# Patient Record
Sex: Female | Born: 1945 | Race: White | Hispanic: No | Marital: Married | Smoking: Never smoker
Health system: Southern US, Community
[De-identification: ages and names within clinical notes are randomized; demographics above are authoritative.]

## PROBLEM LIST (undated history)

## (undated) DIAGNOSIS — M1990 Unspecified osteoarthritis, unspecified site: Secondary | ICD-10-CM

## (undated) DIAGNOSIS — D649 Anemia, unspecified: Secondary | ICD-10-CM

## (undated) DIAGNOSIS — E785 Hyperlipidemia, unspecified: Secondary | ICD-10-CM

## (undated) DIAGNOSIS — Z5189 Encounter for other specified aftercare: Secondary | ICD-10-CM

## (undated) DIAGNOSIS — E0590 Thyrotoxicosis, unspecified without thyrotoxic crisis or storm: Secondary | ICD-10-CM

## (undated) DIAGNOSIS — I10 Essential (primary) hypertension: Secondary | ICD-10-CM

## (undated) DIAGNOSIS — M810 Age-related osteoporosis without current pathological fracture: Secondary | ICD-10-CM

## (undated) DIAGNOSIS — J069 Acute upper respiratory infection, unspecified: Secondary | ICD-10-CM

## (undated) HISTORY — PX: OTHER SURGICAL HISTORY: SHX169

## (undated) HISTORY — DX: Encounter for other specified aftercare: Z51.89

## (undated) HISTORY — DX: Anemia, unspecified: D64.9

## (undated) HISTORY — DX: Age-related osteoporosis without current pathological fracture: M81.0

## (undated) HISTORY — PX: DILATION AND CURETTAGE OF UTERUS: SHX78

---

## 1968-11-07 DIAGNOSIS — Z5189 Encounter for other specified aftercare: Secondary | ICD-10-CM

## 1968-11-07 HISTORY — DX: Encounter for other specified aftercare: Z51.89

## 2011-03-10 HISTORY — PX: COLONOSCOPY: SHX174

## 2011-04-17 ENCOUNTER — Encounter (HOSPITAL_COMMUNITY): Payer: Self-pay | Admitting: Pharmacist

## 2011-04-23 ENCOUNTER — Other Ambulatory Visit: Payer: Self-pay

## 2011-04-23 ENCOUNTER — Encounter (HOSPITAL_COMMUNITY): Payer: Self-pay

## 2011-04-23 ENCOUNTER — Encounter (HOSPITAL_COMMUNITY)
Admission: RE | Admit: 2011-04-23 | Discharge: 2011-04-23 | Disposition: A | Discharge status: Home / Self Care | Payer: PRIVATE HEALTH INSURANCE | Source: Ambulatory Visit | Attending: Obstetrics and Gynecology | Admitting: Obstetrics and Gynecology

## 2011-04-23 HISTORY — DX: Acute upper respiratory infection, unspecified: J06.9

## 2011-04-23 HISTORY — DX: Unspecified osteoarthritis, unspecified site: M19.90

## 2011-04-23 HISTORY — DX: Thyrotoxicosis, unspecified without thyrotoxic crisis or storm: E05.90

## 2011-04-23 HISTORY — DX: Encounter for other specified aftercare: Z51.89

## 2011-04-23 HISTORY — DX: Essential (primary) hypertension: I10

## 2011-04-23 HISTORY — DX: Hyperlipidemia, unspecified: E78.5

## 2011-04-23 LAB — BASIC METABOLIC PANEL
BUN: 16 mg/dL (ref 6–23)
CO2: 30 mEq/L (ref 19–32)
Chloride: 103 mEq/L (ref 96–112)
GFR calc Af Amer: >90 mL/min (ref >90)
Glucose, Bld: 88 mg/dL (ref 70–99)
Potassium: 3.3 mEq/L — ABNORMAL LOW (ref 3.5–5.1)

## 2011-04-23 LAB — CBC
HCT: 38 % (ref 36.0–46.0)
Hemoglobin: 12.8 g/dL (ref 12.0–15.0)
MCH: 28.3 pg (ref 26.0–34.0)
MCHC: 33.7 g/dL (ref 30.0–36.0)
MCV: 84.1 fL (ref 78.0–100.0)

## 2011-04-23 LAB — SURGICAL PCR SCREEN
MRSA, PCR: NEGATIVE
Staphylococcus aureus: NEGATIVE

## 2011-04-23 NOTE — Patient Instructions (Addendum)
   Your procedure is scheduled on: Thursday, Feb 21st  Enter through the [employer] of [facility] at: [employer] up the phone at the desk and dial [phone] and inform us of your arrival.  Please call this number if you have any problems the morning of surgery: [phone]  Remember: Do not eat food after midnight: Wednesday Do not drink clear liquids after: Wednesday Take these medicines the morning of surgery with a SIP OF WATER: None, patient takes BP meds at night.  Patient to bring Albuterol inhaler with her on day of surgery.  Do not wear jewelry, make-up, or FINGER nail polish Do not wear lotions, powders, perfumes or deodorant. Do not shave 48 hours prior to surgery. Do not bring valuables to the hospital.  Leave suitcase in the car. After Surgery it may be brought to your room. For patients being admitted to the hospital, checkout time is 11:00am the day of discharge. Home with   Patients discharged on the day of surgery will not be allowed to drive home.     Remember to use your hibiclens as instructed.Please shower with 1/2 bottle the evening before your surgery and the other 1/2 bottle the morning of surgery.

## 2011-04-23 NOTE — Pre-Procedure Instructions (Signed)
Reviewed patient's history and meds with Dr Arby Barrette.  Patient had the flu in Dec. 2012 but still continues to have congestion/cough.  On Monday, 04/20/11, patient was given zithromax and albuterol inhaler to help treat sx.  Patient was also given a new BP med.  Her BP today was 168/75.  Dr Arby Barrette informed.  Ok to continue with PAT appt.  Per Dr Arby Barrette, ok to see patient DOS.  Instructed patient to bring inhaler with her on DOS. Patient verbalized understanding.

## 2011-04-23 NOTE — Pre-Procedure Instructions (Signed)
Inform Sangita, Lab Tech at [facility] of patient's history of blood transfusion-2 units in [place] in the 1970's.

## 2011-04-27 NOTE — H&P (Signed)
  Patient name  Marcile, Fuquay DICTATION#  [id number] CSN#   [id number]  Juluis Mire, MD 04/27/2011 1:37 PM

## 2011-04-28 NOTE — H&P (Signed)
Audrey Holland, Audrey Holland               ACCOUNT NO.:  [id number]  MEDICAL RECORD NO.:  [id number]  LOCATION:  [facility]                           FACILITY:  [facility]  PHYSICIAN:  Juluis Mire, M.D.   DATE OF BIRTH:  [birth year]  DATE OF ADMISSION:  04/23/2011 DATE OF DISCHARGE:  04/23/2011                             HISTORY & PHYSICAL   HISTORY OF PRESENT ILLNESS:  The patient is a 66 year old, gravida 8, para 7 postmenopausal patient presents for surgical management of pelvic relaxation with associated stress incontinence.  The patient had complained of complete uterine prolapse with something protruding from her vagina.  She was also having stress incontinence at this point in time.  Examination revealed complete uterine prolapse with a significant cystocele.  We did do urodynamic testing in the office.  She did leak, had normal leak point pressures and borderline urethral pressure profile.  There was no evidence of obstructive voiding pattern.  No overactive bladder activity.  Alternatives for management have been discussed including use of pessary versus surgical management for which she is admitted at the present time.  Plan is to proceed with a laparoscopic-assisted vaginal hysterectomy, bilateral salpingo- oophorectomy, anterior and posterior repair, sacrospinous ligament suspension, and mid urethral sling along with cystoscopy.  ALLERGIES:  She has no known drug allergies.  MEDICATIONS:  She is on lisinopril 40 mg a day, hydrochlorothiazide 12.5 mg a day, albuterol 2 puffs every 6 hours.  PAST MEDICAL HISTORY:  Does have a history of hypertension under active management.  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  SOCIAL HISTORY:  Reveals no tobacco, alcohol use.  FAMILY HISTORY:  There is a family history of breast cancer and diabetes.  REVIEW OF SYSTEMS:  Noncontributory.  PHYSICAL EXAMINATION:  VITAL SIGNS:  The patient is afebrile.  Stable vital  signs. HEENT:  The patient is normocephalic.  Pupils equal, round, reactive to light and accommodation.  Extraocular movements were intact.  Sclerae and conjunctivae clear.  Oropharynx clear. NECK:  She does have a midline thyroid mass.  States has been evaluated in [place] with previous biopsy and has been watched closely.  No lymphadenopathy is noted. BREASTS:  No skin or nipple changes.  No dominant mass, adenopathy, or nipple discharge. LUNGS:  Clear. CARDIOVASCULAR:  Regular rhythm and rate.  There are no murmurs or gallops.  No carotid or abdominal bruits. ABDOMEN:  Benign.  No mass, organomegaly, or tenderness. PELVIC:  Normal external genitalia.  Does have complete prolapse of the uterus along with significant cystocele and mild rectocele.  Uterus felt to be of normal size and shape.  Adnexa free of mass or tenderness. EXTREMITIES:  Trace edema. NEUROLOGIC:  Grossly within limits.  IMPRESSION: 1. 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 2. Thyroid nodules 3. Hypertension.  PLAN:  Again alternatives have been discussed.  The patient will undergo a laparoscopic-assisted vaginal hysterectomy with bilateral salpingo- oophorectomy, A and P repair, mid urethral sling, and sacrospinous ligament suspension.  The nature of procedure have been discussed. Potential risk of recurrence have been explained.  This could be  in the 25-30% range.  Risk of surgery discussed including the risk of infection, the risk of hemorrhage that could require transfusion with the risk of AIDS, hepatitis.  Risk of injury to adjacent organs such as bladder, bowel, ureters that could require further exploratory surgery. Risk of deep venous thrombosis and pulmonary embolus.  In view of using the sling, we discussed that we are using the mesh material.  Mesh can erode into the vagina, bladder or urethra requiring surgical removal. Mesh rejection  can lead to chronic pelvic pain and painful intercourse requiring removal of the mesh.  There is a risk of over tightening of the mesh that could lead to obstructive voiding pattern requiring loosening the sling with some possible recurrence of incontinence.  We have also discussed the development of bladder spasms that can require medical therapy.  We discussed both the obturator nerve injury as well as pudendal nerve injury and associated symptomatology.  The patient expressed understanding of indications, risk as well as alternatives.     Juluis Mire, M.D.     JSM/MEDQ  D:  04/27/2011  T:  04/28/2011  Job:  [id number]

## 2011-04-30 ENCOUNTER — Other Ambulatory Visit: Payer: Self-pay | Admitting: Obstetrics and Gynecology

## 2011-04-30 ENCOUNTER — Encounter (HOSPITAL_COMMUNITY): Payer: Self-pay | Admitting: Anesthesiology

## 2011-04-30 ENCOUNTER — Inpatient Hospital Stay (HOSPITAL_COMMUNITY)
Admission: RE | Admit: 2011-04-30 | Discharge: 2011-05-01 | DRG: 743 | Disposition: A | Discharge status: Home / Self Care | Payer: PRIVATE HEALTH INSURANCE | Source: Ambulatory Visit | Attending: Obstetrics and Gynecology | Admitting: Obstetrics and Gynecology

## 2011-04-30 ENCOUNTER — Inpatient Hospital Stay (HOSPITAL_COMMUNITY): Payer: PRIVATE HEALTH INSURANCE | Admitting: Anesthesiology

## 2011-04-30 ENCOUNTER — Encounter (HOSPITAL_COMMUNITY)
Admission: RE | Disposition: A | Discharge status: Home / Self Care | Payer: Self-pay | Source: Ambulatory Visit | Attending: Obstetrics and Gynecology

## 2011-04-30 ENCOUNTER — Encounter (HOSPITAL_COMMUNITY): Payer: Self-pay | Admitting: *Deleted

## 2011-04-30 DIAGNOSIS — N812 Incomplete uterovaginal prolapse: Principal | ICD-10-CM | POA: Diagnosis present

## 2011-04-30 DIAGNOSIS — D251 Intramural leiomyoma of uterus: Secondary | ICD-10-CM | POA: Diagnosis present

## 2011-04-30 DIAGNOSIS — Z90710 Acquired absence of both cervix and uterus: Secondary | ICD-10-CM

## 2011-04-30 DIAGNOSIS — N393 Stress incontinence (female) (male): Secondary | ICD-10-CM | POA: Diagnosis present

## 2011-04-30 DIAGNOSIS — N840 Polyp of corpus uteri: Secondary | ICD-10-CM | POA: Diagnosis present

## 2011-04-30 DIAGNOSIS — N8189 Other female genital prolapse: Secondary | ICD-10-CM

## 2011-04-30 HISTORY — PX: ANTERIOR AND POSTERIOR REPAIR: SHX5121

## 2011-04-30 HISTORY — PX: SALPINGOOPHORECTOMY: SHX82

## 2011-04-30 HISTORY — PX: CYSTOSCOPY: SHX5120

## 2011-04-30 HISTORY — PX: LAPAROSCOPIC ASSISTED VAGINAL HYSTERECTOMY: SHX5398

## 2011-04-30 HISTORY — PX: BLADDER SUSPENSION: SHX72

## 2011-04-30 SURGERY — HYSTERECTOMY, VAGINAL, LAPAROSCOPY-ASSISTED
Anesthesia: General | Site: Vagina | Wound class: Clean Contaminated

## 2011-04-30 MED ORDER — ONDANSETRON HCL 4 MG/2ML IJ SOLN: 4.0000 mg | Freq: Four times a day (QID) | INTRAMUSCULAR | Status: DC | PRN

## 2011-04-30 MED ORDER — PROPOFOL 10 MG/ML IV EMUL
INTRAVENOUS | Status: DC | PRN
  Administered 2011-04-30: 170 mg via INTRAVENOUS

## 2011-04-30 MED ORDER — LIDOCAINE HCL (CARDIAC) 20 MG/ML IV SOLN
INTRAVENOUS | Status: AC
  Filled 2011-04-30: qty 5

## 2011-04-30 MED ORDER — FENTANYL CITRATE 0.05 MG/ML IJ SOLN
INTRAMUSCULAR | Status: AC
  Filled 2011-04-30: qty 5

## 2011-04-30 MED ORDER — NEOSTIGMINE METHYLSULFATE 1 MG/ML IJ SOLN
INTRAMUSCULAR | Status: DC | PRN
  Administered 2011-04-30: 1.5 mg via INTRAVENOUS

## 2011-04-30 MED ORDER — MENTHOL 3 MG MT LOZG: 1.0000 | LOZENGE | OROMUCOSAL | Status: DC | PRN

## 2011-04-30 MED ORDER — KETOROLAC TROMETHAMINE 30 MG/ML IJ SOLN
INTRAMUSCULAR | Status: DC | PRN
  Administered 2011-04-30: 30 mg via INTRAVENOUS

## 2011-04-30 MED ORDER — ONDANSETRON HCL 4 MG PO TABS: 4.0000 mg | ORAL_TABLET | Freq: Four times a day (QID) | ORAL | Status: DC | PRN

## 2011-04-30 MED ORDER — LIDOCAINE-EPINEPHRINE (PF) 1 %-1:200000 IJ SOLN
INTRAMUSCULAR | Status: AC
  Filled 2011-04-30: qty 10

## 2011-04-30 MED ORDER — OXYCODONE-ACETAMINOPHEN 5-325 MG PO TABS: 1.0000 | ORAL_TABLET | ORAL | Status: DC | PRN

## 2011-04-30 MED ORDER — ESTRADIOL 0.1 MG/GM VA CREA
TOPICAL_CREAM | VAGINAL | Status: AC
  Filled 2011-04-30: qty 42.5

## 2011-04-30 MED ORDER — BUPIVACAINE HCL (PF) 0.25 % IJ SOLN
INTRAMUSCULAR | Status: AC
  Filled 2011-04-30: qty 30

## 2011-04-30 MED ORDER — ONDANSETRON HCL 4 MG/2ML IJ SOLN
INTRAMUSCULAR | Status: AC
  Filled 2011-04-30: qty 2

## 2011-04-30 MED ORDER — PROMETHAZINE HCL 25 MG/ML IJ SOLN: 6.2500 mg | INTRAMUSCULAR | Status: DC | PRN

## 2011-04-30 MED ORDER — DIPHENHYDRAMINE HCL 50 MG/ML IJ SOLN: 12.5000 mg | Freq: Four times a day (QID) | INTRAMUSCULAR | Status: DC | PRN

## 2011-04-30 MED ORDER — ACETAMINOPHEN 325 MG PO TABS: 325.0000 mg | ORAL_TABLET | ORAL | Status: DC | PRN

## 2011-04-30 MED ORDER — NEOSTIGMINE METHYLSULFATE 1 MG/ML IJ SOLN
INTRAMUSCULAR | Status: AC
  Filled 2011-04-30: qty 10

## 2011-04-30 MED ORDER — LIDOCAINE HCL (CARDIAC) 20 MG/ML IV SOLN
INTRAVENOUS | Status: DC | PRN
  Administered 2011-04-30: 80 mg via INTRAVENOUS

## 2011-04-30 MED ORDER — FENTANYL CITRATE 0.05 MG/ML IJ SOLN
INTRAMUSCULAR | Status: DC | PRN
  Administered 2011-04-30: 100 ug via INTRAVENOUS
  Administered 2011-04-30: 50 ug via INTRAVENOUS
  Administered 2011-04-30: 100 ug via INTRAVENOUS
  Administered 2011-04-30: 50 ug via INTRAVENOUS

## 2011-04-30 MED ORDER — SODIUM CHLORIDE 0.9 % IJ SOLN: 9.0000 mL | INTRAMUSCULAR | Status: DC | PRN

## 2011-04-30 MED ORDER — MIDAZOLAM HCL 2 MG/2ML IJ SOLN
INTRAMUSCULAR | Status: AC
  Filled 2011-04-30: qty 2

## 2011-04-30 MED ORDER — FENTANYL CITRATE 0.05 MG/ML IJ SOLN
INTRAMUSCULAR | Status: AC
  Filled 2011-04-30: qty 2

## 2011-04-30 MED ORDER — ACETAMINOPHEN 325 MG PO TABS: 650.0000 mg | ORAL_TABLET | ORAL | Status: DC | PRN

## 2011-04-30 MED ORDER — BUPIVACAINE-EPINEPHRINE (PF) 0.5% -1:200000 IJ SOLN
INTRAMUSCULAR | Status: AC
  Filled 2011-04-30: qty 10

## 2011-04-30 MED ORDER — CEFAZOLIN SODIUM 1-5 GM-% IV SOLN
INTRAVENOUS | Status: AC
  Filled 2011-04-30: qty 50

## 2011-04-30 MED ORDER — INDIGOTINDISULFONATE SODIUM 8 MG/ML IJ SOLN
INTRAMUSCULAR | Status: DC | PRN
  Administered 2011-04-30: 5 mL via INTRAVENOUS

## 2011-04-30 MED ORDER — INDIGOTINDISULFONATE SODIUM 8 MG/ML IJ SOLN
INTRAMUSCULAR | Status: AC
  Filled 2011-04-30: qty 5

## 2011-04-30 MED ORDER — MIDAZOLAM HCL 5 MG/5ML IJ SOLN
INTRAMUSCULAR | Status: DC | PRN
  Administered 2011-04-30: 2 mg via INTRAVENOUS

## 2011-04-30 MED ORDER — FENTANYL CITRATE 0.05 MG/ML IJ SOLN: 25.0000 ug | INTRAMUSCULAR | Status: DC | PRN

## 2011-04-30 MED ORDER — KETOROLAC TROMETHAMINE 30 MG/ML IJ SOLN: 15.0000 mg | Freq: Once | INTRAMUSCULAR | Status: DC | PRN

## 2011-04-30 MED ORDER — GLYCOPYRROLATE 0.2 MG/ML IJ SOLN
INTRAMUSCULAR | Status: AC
  Filled 2011-04-30: qty 2

## 2011-04-30 MED ORDER — LIDOCAINE-EPINEPHRINE (PF) 1 %-1:200000 IJ SOLN
INTRAMUSCULAR | Status: DC | PRN
  Administered 2011-04-30: 22 mL

## 2011-04-30 MED ORDER — PROPOFOL 10 MG/ML IV EMUL
INTRAVENOUS | Status: AC
  Filled 2011-04-30: qty 20

## 2011-04-30 MED ORDER — DEXAMETHASONE SODIUM PHOSPHATE 10 MG/ML IJ SOLN
INTRAMUSCULAR | Status: AC
  Filled 2011-04-30: qty 1

## 2011-04-30 MED ORDER — ZOLPIDEM TARTRATE 5 MG PO TABS: 5.0000 mg | ORAL_TABLET | Freq: Every evening | ORAL | Status: DC | PRN

## 2011-04-30 MED ORDER — HYDROMORPHONE 0.3 MG/ML IV SOLN
INTRAVENOUS | Status: AC
  Filled 2011-04-30: qty 25

## 2011-04-30 MED ORDER — STERILE WATER FOR IRRIGATION IR SOLN
Status: DC | PRN
  Administered 2011-04-30: 1000 mL

## 2011-04-30 MED ORDER — DIPHENHYDRAMINE HCL 12.5 MG/5ML PO ELIX
12.5000 mg | ORAL_SOLUTION | Freq: Four times a day (QID) | ORAL | Status: DC | PRN
  Filled 2011-04-30: qty 5

## 2011-04-30 MED ORDER — LACTATED RINGERS IV SOLN
INTRAVENOUS | Status: DC
  Administered 2011-04-30 – 2011-05-01 (×2): via INTRAVENOUS

## 2011-04-30 MED ORDER — HYDROMORPHONE 0.3 MG/ML IV SOLN
INTRAVENOUS | Status: DC
  Administered 2011-04-30: 1.39 mg via INTRAVENOUS
  Administered 2011-04-30: 3.3 mL via INTRAVENOUS
  Administered 2011-04-30: 13:00:00 via INTRAVENOUS
  Administered 2011-05-01: 0.2 mg via INTRAVENOUS
  Administered 2011-05-01: 1.33 mL via INTRAVENOUS
  Administered 2011-05-01: 0.4 mg via INTRAVENOUS
  Administered 2011-05-01: 0.999 mg via INTRAVENOUS

## 2011-04-30 MED ORDER — DEXAMETHASONE SODIUM PHOSPHATE 4 MG/ML IJ SOLN
INTRAMUSCULAR | Status: DC | PRN
  Administered 2011-04-30: 6 mg via INTRAVENOUS

## 2011-04-30 MED ORDER — BUPIVACAINE HCL (PF) 0.25 % IJ SOLN
INTRAMUSCULAR | Status: DC | PRN
  Administered 2011-04-30: 10 mL

## 2011-04-30 MED ORDER — GLYCOPYRROLATE 0.2 MG/ML IJ SOLN
INTRAMUSCULAR | Status: DC | PRN
  Administered 2011-04-30: .6 mg via INTRAVENOUS

## 2011-04-30 MED ORDER — ROCURONIUM BROMIDE 50 MG/5ML IV SOLN
INTRAVENOUS | Status: AC
  Filled 2011-04-30: qty 1

## 2011-04-30 MED ORDER — LACTATED RINGERS IV SOLN
INTRAVENOUS | Status: DC
  Administered 2011-04-30: 08:00:00 via INTRAVENOUS
  Administered 2011-04-30: 50 mL/h via INTRAVENOUS
  Administered 2011-04-30 (×2): via INTRAVENOUS

## 2011-04-30 MED ORDER — NALOXONE HCL 0.4 MG/ML IJ SOLN: 0.4000 mg | INTRAMUSCULAR | Status: DC | PRN

## 2011-04-30 MED ORDER — ONDANSETRON HCL 4 MG/2ML IJ SOLN
INTRAMUSCULAR | Status: DC | PRN
  Administered 2011-04-30: 4 mg via INTRAVENOUS

## 2011-04-30 MED ORDER — CEFAZOLIN SODIUM 1-5 GM-% IV SOLN
1.0000 g | INTRAVENOUS | Status: AC
  Administered 2011-04-30: 1 g via INTRAVENOUS

## 2011-04-30 MED ORDER — ROCURONIUM BROMIDE 100 MG/10ML IV SOLN
INTRAVENOUS | Status: DC | PRN
  Administered 2011-04-30: 30 mg via INTRAVENOUS
  Administered 2011-04-30: 20 mg via INTRAVENOUS

## 2011-04-30 SURGICAL SUPPLY — 61 items
BANDAGE GAUZE ELAST BULKY 4 IN (GAUZE/BANDAGES/DRESSINGS) ×6 IMPLANT
BLADE SURG 11 STRL SS (BLADE) ×6 IMPLANT
BLADE SURG 15 STRL LF C SS BP (BLADE) ×5 IMPLANT
BLADE SURG 15 STRL SS (BLADE) ×1
CANISTER SUCTION 2500CC (MISCELLANEOUS) ×6 IMPLANT
CATH ROBINSON RED A/P 16FR (CATHETERS) ×6 IMPLANT
CLOTH BEACON ORANGE TIMEOUT ST (SAFETY) ×6 IMPLANT
CONT PATH 16OZ SNAP LID 3702 (MISCELLANEOUS) ×6 IMPLANT
COVER TABLE BACK 60X90 (DRAPES) ×6 IMPLANT
DECANTER SPIKE VIAL GLASS SM (MISCELLANEOUS) ×6 IMPLANT
DERMABOND ADVANCED (GAUZE/BANDAGES/DRESSINGS) ×2
DERMABOND ADVANCED .7 DNX12 (GAUZE/BANDAGES/DRESSINGS) ×10 IMPLANT
DEVICE CAPIO SUTURING (INSTRUMENTS) ×1
DEVICE CAPIO SUTURING OPC (INSTRUMENTS) ×5 IMPLANT
DRAPE CAMERA CLOSED 9X96 (DRAPES) ×6 IMPLANT
DRAPE HYSTEROSCOPY (DRAPE) ×6 IMPLANT
ELECT REM PT RETURN 9FT ADLT (ELECTROSURGICAL) ×6
ELECTRODE REM PT RTRN 9FT ADLT (ELECTROSURGICAL) ×5 IMPLANT
GAUZE KERLIX 2  STERILE LF (GAUZE/BANDAGES/DRESSINGS) ×6 IMPLANT
GLOVE BIO SURGEON STRL SZ7 (GLOVE) ×18 IMPLANT
GLOVE BIOGEL PI IND STRL 6.5 (GLOVE) ×5 IMPLANT
GLOVE BIOGEL PI INDICATOR 6.5 (GLOVE) ×1
GLOVE INDICATOR 7.0 STRL GRN (GLOVE) ×6 IMPLANT
GLOVE SURG ORTHO 8.0 STRL STRW (GLOVE) ×6 IMPLANT
GOWN PREVENTION PLUS LG XLONG (DISPOSABLE) ×18 IMPLANT
NEEDLE HYPO 22GX1.5 SAFETY (NEEDLE) ×6 IMPLANT
NEEDLE INSUFFLATION 14GA 120MM (NEEDLE) ×6 IMPLANT
NEEDLE MAYO .5 CIRCLE (NEEDLE) ×6 IMPLANT
NEEDLE MAYO 6 CRC TAPER PT (NEEDLE) ×6 IMPLANT
NS IRRIG 1000ML POUR BTL (IV SOLUTION) ×6 IMPLANT
PACK LAVH (CUSTOM PROCEDURE TRAY) ×6 IMPLANT
PACK VAGINAL WOMENS (CUSTOM PROCEDURE TRAY) ×6 IMPLANT
PROTECTOR NERVE ULNAR (MISCELLANEOUS) ×6 IMPLANT
SEALER TISSUE G2 CVD JAW 45CM (ENDOMECHANICALS) ×6 IMPLANT
SET CYSTO W/LG BORE CLAMP LF (SET/KITS/TRAYS/PACK) ×6 IMPLANT
SET IRRIG TUBING LAPAROSCOPIC (IRRIGATION / IRRIGATOR) ×6 IMPLANT
SLING HALO OBTRYX (Sling) ×6 IMPLANT
STRIP CLOSURE SKIN 1/4X3 (GAUZE/BANDAGES/DRESSINGS) IMPLANT
SUT CAPIO POLYGLYCOLIC (SUTURE) ×6 IMPLANT
SUT MON AB 2-0 CT1 27 (SUTURE) ×6 IMPLANT
SUT MON AB 3-0 SH 27 (SUTURE)
SUT MON AB 3-0 SH27 (SUTURE) IMPLANT
SUT VIC AB 0 CT1 18XCR BRD8 (SUTURE) ×15 IMPLANT
SUT VIC AB 0 CT1 27 (SUTURE) ×1
SUT VIC AB 0 CT1 27XBRD ANBCTR (SUTURE) ×5 IMPLANT
SUT VIC AB 0 CT1 36 (SUTURE) ×12 IMPLANT
SUT VIC AB 0 CT1 8-18 (SUTURE) ×3
SUT VIC AB 2-0 CT1 27 (SUTURE) ×2
SUT VIC AB 2-0 CT1 TAPERPNT 27 (SUTURE) ×10 IMPLANT
SUT VIC AB 2-0 CT2 27 (SUTURE) ×24 IMPLANT
SUT VIC AB 2-0 UR6 27 (SUTURE) ×12 IMPLANT
SUT VICRYL 0 UR6 27IN ABS (SUTURE) ×12 IMPLANT
SUT VICRYL 1 TIES 12X18 (SUTURE) ×6 IMPLANT
SUT VICRYL 4-0 PS2 18IN ABS (SUTURE) ×6 IMPLANT
TOWEL OR 17X24 6PK STRL BLUE (TOWEL DISPOSABLE) ×12 IMPLANT
TRAY FOLEY CATH 14FR (SET/KITS/TRAYS/PACK) ×6 IMPLANT
TROCAR BALLN 12MMX100 BLUNT (TROCAR) ×6 IMPLANT
TROCAR Z-THREAD BLADED 11X100M (TROCAR) IMPLANT
TROCAR Z-THREAD BLADED 5X100MM (TROCAR) ×6 IMPLANT
WARMER LAPAROSCOPE (MISCELLANEOUS) ×6 IMPLANT
WATER STERILE IRR 1000ML POUR (IV SOLUTION) ×6 IMPLANT

## 2011-04-30 NOTE — Brief Op Note (Signed)
04/30/2011  10:09 AM  PATIENT:  Audrey Holland  66 y.o. female  PRE-OPERATIVE DIAGNOSIS:  Pelvic Relaxation  POST-OPERATIVE DIAGNOSIS:  Pelvic Relaxation  PROCEDURE:  Procedure(s) (LRB): LAPAROSCOPIC ASSISTED VAGINAL HYSTERECTOMY (N/A) SALPINGO OOPHERECTOMY (Bilateral) ANTERIOR (CYSTOCELE) AND POSTERIOR REPAIR (RECTOCELE) (N/A) TRANSVAGINAL TAPE (TVT) PROCEDURE (N/A) CYSTOSCOPY (N/A)  SURGEON:  Surgeon(s) and Role:    * Juluis Mire, MD - Primary    * Turner Daniels, MD - Assisting  PHYSICIAN ASSISTANT:   ASSISTANTS: david lowe   ANESTHESIA:   general  EBL:  Total I/O In: 1900 [I.V.:1900] Out: 300 [Urine:200; Blood:100]  BLOOD ADMINISTERED:none  DRAINS: Urinary Catheter (Foley)   LOCAL MEDICATIONS USED:  MARCAINE     SPECIMEN:  Source of Specimen:  uterus tubes andovaries  DISPOSITION OF SPECIMEN:  PATHOLOGY  COUNTS:  YES  TOURNIQUET:  * No tourniquets in log *  DICTATION: .Other Dictation: Dictation Number [id number]  PLAN OF CARE: Admit for overnight observation  PATIENT DISPOSITION:  PACU - hemodynamically stable.   Delay start of Pharmacological VTE agent (>24hrs) due to surgical blood loss or risk of bleeding: no

## 2011-04-30 NOTE — Op Note (Signed)
Patient name  Audrey Holland, Audrey Holland DICTATION#  [id number] CSN# [id number]  [facility], MD 04/30/2011 10:11 AM

## 2011-04-30 NOTE — H&P (Signed)
  History and physical exam unchanged 

## 2011-04-30 NOTE — Anesthesia Postprocedure Evaluation (Signed)
  Anesthesia Post-op Note  Patient: Audrey Holland  Procedure(s) Performed: Procedure(s) (LRB): LAPAROSCOPIC ASSISTED VAGINAL HYSTERECTOMY (N/A) SALPINGO OOPHERECTOMY (Bilateral) ANTERIOR (CYSTOCELE) AND POSTERIOR REPAIR (RECTOCELE) (N/A) TRANSVAGINAL TAPE (TVT) PROCEDURE (N/A) CYSTOSCOPY (N/A)  Patient Location: PACU  Anesthesia Type: General  Level of Consciousness: awake, alert  and oriented  Airway and Oxygen Therapy: Patient Spontanous Breathing  Post-op Pain: none  Post-op Assessment: Post-op Vital signs reviewed, Patient's Cardiovascular Status Stable, Respiratory Function Stable, Patent Airway, No signs of Nausea or vomiting and Pain level controlled  Post-op Vital Signs: Reviewed and stable  Complications: No apparent anesthesia complications

## 2011-04-30 NOTE — Progress Notes (Signed)
Patient ID: Audrey Holland, female   DOB: [birth year], 66 y.o.   MRN: [id number] Doing well Af vss abd soft Good uo

## 2011-04-30 NOTE — Anesthesia Procedure Notes (Signed)
Procedure Name: Intubation Date/Time: 04/30/2011 7:32 AM Performed by: Carlyle Lipa Pre-anesthesia Checklist: Patient identified, Emergency Drugs available, Suction available, Patient being monitored and Timeout performed Patient Re-evaluated:Patient Re-evaluated prior to inductionOxygen Delivery Method: Circle system utilized Preoxygenation: Pre-oxygenation with 100% oxygen Intubation Type: IV induction Ventilation: Mask ventilation without difficulty Laryngoscope Size: Miller and 2 Grade View: Grade II Tube type: Oral Number of attempts: 2 Placement Confirmation: ETT inserted through vocal cords under direct vision,  positive ETCO2 and breath sounds checked- equal and bilateral Secured at: 22 cm Tube secured with: Tape Dental Injury: Teeth and Oropharynx as per pre-operative assessment

## 2011-04-30 NOTE — Transfer of Care (Signed)
Immediate Anesthesia Transfer of Care Note  Patient: Audrey Holland  Procedure(s) Performed: Procedure(s) (LRB): LAPAROSCOPIC ASSISTED VAGINAL HYSTERECTOMY (N/A) SALPINGO OOPHERECTOMY (Bilateral) ANTERIOR (CYSTOCELE) AND POSTERIOR REPAIR (RECTOCELE) (N/A) TRANSVAGINAL TAPE (TVT) PROCEDURE (N/A) CYSTOSCOPY (N/A)  Patient Location: PACU  Anesthesia Type: General  Level of Consciousness: sedated  Airway & Oxygen Therapy: Patient Spontanous Breathing and Patient connected to nasal cannula oxygen  Post-op Assessment: Report given to PACU RN and Post -op Vital signs reviewed and stable  Post vital signs: Reviewed and stable  Complications: No apparent anesthesia complications

## 2011-04-30 NOTE — Anesthesia Preprocedure Evaluation (Addendum)
Anesthesia Evaluation  Patient identified by MRN, date of birth, ID band Patient awake    Reviewed: Allergy & Precautions, H&P , Patient's Chart, lab work & pertinent test results, reviewed documented beta blocker date and time   History of Anesthesia Complications Negative for: history of anesthetic complications  Airway Mallampati: III TM Distance: >3 FB Neck ROM: full  Mouth opening: Limited Mouth Opening  Dental No notable dental hx.    Pulmonary neg pulmonary ROS, Recent URI ,  clear to auscultation  Pulmonary exam normal       Cardiovascular Exercise Tolerance: Good hypertension, neg cardio ROS regular Normal    Neuro/Psych Negative Neurological ROS  Negative Psych ROS   GI/Hepatic negative GI ROS, Neg liver ROS,   Endo/Other  Negative Endocrine ROSHyperthyroidism   Renal/GU negative Renal ROS     Musculoskeletal   Abdominal   Peds  Hematology negative hematology ROS (+)   Anesthesia Other Findings   Reproductive/Obstetrics negative OB ROS                          Anesthesia Physical Anesthesia Plan  ASA: II  Anesthesia Plan: General ETT   Post-op Pain Management:    Induction:   Airway Management Planned:   Additional Equipment:   Intra-op Plan:   Post-operative Plan:   Informed Consent: I have reviewed the patients History and Physical, chart, labs and discussed the procedure including the risks, benefits and alternatives for the proposed anesthesia with the patient or authorized representative who has indicated his/her understanding and acceptance.   Dental Advisory Given  Plan Discussed with: CRNA and Surgeon  Anesthesia Plan Comments:         Anesthesia Quick Evaluation

## 2011-05-01 ENCOUNTER — Encounter (HOSPITAL_COMMUNITY): Payer: Self-pay | Admitting: Obstetrics and Gynecology

## 2011-05-01 LAB — CBC
Hemoglobin: 9.7 g/dL — ABNORMAL LOW (ref 12.0–15.0)
RBC: 3.46 MIL/uL — ABNORMAL LOW (ref 3.87–5.11)
WBC: 9.9 10*3/uL (ref 4.0–10.5)

## 2011-05-01 MED ORDER — ACETAMINOPHEN-CODEINE #3 300-30 MG PO TABS: 1.0000 | ORAL_TABLET | ORAL | Status: DC | PRN

## 2011-05-01 NOTE — Anesthesia Postprocedure Evaluation (Signed)
  Anesthesia Post-op Note  Patient: Audrey Holland  Procedure(s) Performed: Procedure(s) (LRB): LAPAROSCOPIC ASSISTED VAGINAL HYSTERECTOMY (N/A) SALPINGO OOPHERECTOMY (Bilateral) ANTERIOR (CYSTOCELE) AND POSTERIOR REPAIR (RECTOCELE) (N/A) TRANSVAGINAL TAPE (TVT) PROCEDURE (N/A) CYSTOSCOPY (N/A)  Patient Location: PACU and Women's Unit  Anesthesia Type: General  Level of Consciousness: awake, alert  and oriented  Airway and Oxygen Therapy: Patient Spontanous Breathing  Post-op Pain: none  Post-op Assessment: Post-op Vital signs reviewed  Post-op Vital Signs: Reviewed and stable  Complications: No apparent anesthesia complications

## 2011-05-01 NOTE — Discharge Summary (Signed)
  Patient name  Audrey Holland, Audrey Holland [id number] CSN#  [id number] Juluis Mire, MD 05/01/2011 1:28 PM

## 2011-05-01 NOTE — Progress Notes (Signed)
1 Day Post-Op Procedure(s) (LRB): LAPAROSCOPIC ASSISTED VAGINAL HYSTERECTOMY (N/A) SALPINGO OOPHERECTOMY (Bilateral) ANTERIOR (CYSTOCELE) AND POSTERIOR REPAIR (RECTOCELE) (N/A) TRANSVAGINAL TAPE (TVT) PROCEDURE (N/A) CYSTOSCOPY (N/A)  Subjective: Patient reports tolerating PO.    Objective: I have reviewed patient's vital signs.  abdomin soft positive bowel sounds Inc clear Packed removed Foley removed without instilling irrigation will  Have to wait to see if can void Assessment: s/p Procedure(s) (LRB): LAPAROSCOPIC ASSISTED VAGINAL HYSTERECTOMY (N/A) SALPINGO OOPHERECTOMY (Bilateral) ANTERIOR (CYSTOCELE) AND POSTERIOR REPAIR (RECTOCELE) (N/A) TRANSVAGINAL TAPE (TVT) PROCEDURE (N/A) CYSTOSCOPY (N/A): stable  Plan: Discharge home  LOS: 1 day    Kay Shippy S 05/01/2011, 8:24 AM

## 2011-05-01 NOTE — Discharge Instructions (Signed)
Hysterectomy A hysterectomy is a procedure where your womb (uterus) is surgically taken out. It will no longer be possible to have menstrual periods or to become pregnant. Removal of the tubes and ovaries (bilateral salpingo-oopherectomy) can be done during this operation as well.   An abdominal hysterectomy is done through a large cut (incision) in the abdomen made by the surgeon.   A vaginal hysterectomy is done through the vagina. There are no abdominal incisions, but there will be incisions on the inside of the vagina.   A laparoscopic assisted vaginal hysterectomy is done through 2 or 3 small incisions in the abdomen, but the uterus is removed and passed through the vagina.  Women who are going to have a hysterectomy should be tested first to make sure there is no cancer of the cervix or in the uterus. INDICATIONS FOR HYSTERECTOMY:  Persistent abnormal bleeding.   Lasting (chronic) pelvic pain.   Endometriosis. This is when the lining of the uterus (endometrium) is misplaced outside of its normal location.   Adenomyosis. This is when the endometrium tissue grows in the muscle of the uterus.   Uterine prolapse. This is when the uterus falls down into the vagina.   Cancer of the uterus or cervix that requires a radical hysterectomy, removal of the uterus, tubes, ovaries, and surrounding lymph nodes.  LET YOUR CAREGIVER KNOW ABOUT:  Allergies (especially to medicines).   Medications taken including herbs, eye drops, over the counter medications, and creams.   Use of steroids (by mouth or creams).   Past problems with anesthetics or numbing medication.   Possibility of pregnancy, if this applies.   History of blood clots (thrombophlebitis).   History of bleeding or blood problems.   Past surgery.   Other health problems.  RISKS AND COMPLICATIONS All surgeries can have risks. Some of these risks are:  A lot of bleeding.   Injury to surrounding organs.   Infection.    Blood clots of the leg, heart, or lung.   Problems with anesthesia.   Early menopause.  BEFORE THE PROCEDURE  Do not take aspirin or blood thinners for a week before surgery, or as directed by your caregiver.   Do not eat or drink anything after midnight the night before surgery, or as directed by your caregiver.   Let your caregiver know if you get a cold or other infectious problems before surgery.   If you are being admitted the day of surgery, you should be present 60 minutes before your procedure or as told by your caregiver.  PROCEDURE   An IV (intravenous) will be placed in your arm. You will be given a drug to make you sleep (anesthetic) during surgery. You may be given a shot in the spine (spinal anesthesia) that will numb your body from the waist down. This will keep you pain-free during surgery.   When you wake from surgery, you will have the IV and a long, narrow, hollow tube (urinary catheter) draining the bladder for 1 or 2 days after surgery. This will make passing your urine easier. It also helps by keeping your bladder empty during surgery.   After surgery, you will be taken to the recovery area where a nurse will watch and check your progress. Once you wake up, stable and taking fluids well, without other problems, you will be allowed to return to your room. Usually you will remain in the hospital 3 to 5 days. You may be given an antibiotic during and after   the surgery and when you go home. Pain medication will be ordered by your caregiver while you are in the hospital and when you go home.  HOME CARE INSTRUCTIONS  Healing will take time. You will have discomfort, tenderness, swelling, and bruising at the operative site for a couple of weeks. This is normal and will get better as time goes on.   Only take over-the-counter or prescription medicines for pain, discomfort, or fever as directed by your caregiver.   Do not take aspirin. It can cause bleeding.   Do not  drive when taking pain medication.   Follow your caregiver's advice regarding diet, exercise, lifting, driving, and general activities.   Resume your usual diet as directed and allowed.   Get plenty of rest and sleep.   Do not douche, use tampons, or have sexual intercourse until your caregiver gives you permission.   Change your bandages (dressings) as directed.   Take your temperature twice a day. Write it down.   Your caregiver may recommend showers instead of baths for a few weeks.   Do not drink alcohol until your caregiver gives you permission.   If you develop constipation, you may take a mild laxative with your caregiver's permission. Bran foods and drinking fluids helps with constipation problems.   Try to have someone home with you for a week or two to help with the household activities.   Make sure you and your family understands everything about your operation and recovery.   Do not sign any legal documents until you feel normal again.   Keep all your follow-up appointments as recommended by your caregiver.  SEEK MEDICAL CARE IF:   There is swelling, redness, or increasing pain in the wound area.   Pus is coming from the wound.   You notice a bad smell from the wound or surgical dressing.   You have pain, redness, and swelling from the intravenous site.   The wound is breaking open (the edges are not staying together).   You feel dizzy or feel like fainting.   You develop pain or bleeding when you urinate.   You develop diarrhea.   You develop nausea and vomiting.   You develop abnormal vaginal discharge.   You develop a rash.   You have any type of abnormal reaction or develop an allergy to your medication.   You need stronger pain medication for your pain.  SEEK IMMEDIATE MEDICAL CARE IF:  You have a fever.   You develop abdominal pain.   You develop chest pain.   You develop shortness of breath.   You pass out.   You develop pain,  swelling, or redness of your leg.   You develop heavy vaginal bleeding with or without blood clots.  Document Released: 08/19/2000 Document Revised: 11/05/2010 Document Reviewed: 07/07/2007 [employer] Patient Information 2012 [employer]. 

## 2011-05-01 NOTE — Progress Notes (Signed)
UR Chart review completed.  

## 2011-05-01 NOTE — Progress Notes (Signed)
Pt unable to void, scanned 125cc. Dr. Arelia Sneddon notified. No orders at this time. Will continue to monitor.

## 2011-05-01 NOTE — Addendum Note (Signed)
Addendum  created 05/01/11 1324 by Isabella Bowens, CRNA   Modules edited:Notes Section

## 2011-05-01 NOTE — Op Note (Signed)
Audrey Holland, Audrey Holland               ACCOUNT NO.:  [id number]  MEDICAL RECORD NO.:  [id number]  LOCATION:  9306                          FACILITY:  [facility]  PHYSICIAN:  Juluis Mire, M.D.   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  OPERATIVE PROCEDURE:  Laparoscopic-assisted vaginal hysterectomy with bilateral salpingo-oophorectomy.  Anterior colporrhaphy.  Mid urethral sling using a transobturator approach.  Cystoscopy.  Posterior colporrhaphy.  Sacrospinous ligament suspension.  SURGEON:  Juluis Mire, MD  ASSISTANT:  Dineen Kid. Rana Snare, MD  ANESTHESIA:  General tracheal.  ESTIMATED BLOOD LOSS:  300-400 mL.  PACKS INCLUDED:  Vaginal pack.  DRAINS:  Urethral Foley.  INTRAOPERATIVE BLOOD PLACED:  None.  COMPLICATIONS:  None.  INDICATION:  Dictated history and physical.  PROCEDURE IN DETAIL:  As follows:  The patient was taken to the OR and placed in supine position.  After satisfactory level of general anesthesia was obtained, the patient was placed in dorsal lithotomy position using Allen stirrups.  The abdomen, perineum, and vagina were prepped out with Betadine.  Bladder was emptied by catheterization. Visualization revealed complete uterine prolapse with associated cystocele.  The Hulka tenaculum was placed into the cervix and secured. The patient was then draped as a sterile field.  A subumbilical incision made with knife.  The Veress needle was introduced into the abdominal cavity.  The abdomen was deflated with approximately 3 L of carbon dioxide.  Operating laparoscope was introduced.  Visualization revealed no evidence of injury to adjacent organs.  A 5-mm trocar was put in place in suprapubic area under direct  visualization.  The liver and tip of the gallbladder were clear.  Both lateral gutters were clear. Uterus, tubes, and ovaries were unremarkable.  Appendix was retrocecal, not visualized.  At this point in time, we identified the ureter along the right pelvic sidewall.  We elevated the ovary and tube.  Using the EnSeal, the ovarian vasculature was cauterized and incised.  Mesenteric attachments of the ovary and tube were cauterized and incised up to the round ligament.  The round ligament was then cauterized and incised.  We then went to the left side.  Left tube and ovary elevated.  Again, we visualize the course of the ureter in the pelvis.  We isolated the ovarian vasculature above this using En-Seal, it was cauterized and incised.  The mesenteric attachments were cauterized and incised and the left round ligament was cauterized and incised.  At this point in time, the decision was made to go vaginally.  Distal laparoscope was removed.  The abdomen was deflated with carbon dioxide.  The patient's legs were repositioned.  Hulka tenaculum was then removed.  Cervix grasped with Christella Hartigan tenaculum.  Cul-de-sac was entered sharply.  Both uterosacral ligaments were clamped, cut, and suture ligated with 0 Vicryl.  The reflection of vaginal mucosa anteriorly was incised and the bladder was dissected superiorly. Paracervical tissue was clamped, cut, and suture ligated with 0 Vicryl. We continued using a  clamp, cut, and tie technique with suture ligature of 0 Vicryl was separate.  The broad ligament from the lower and uterine segments.  At this point in time, the vesicouterine space was entered sharply and retractors put in place.  Uterus was flipped.  Remaining pedicles were clamped and cut.  Uterus, tubes, and ovaries were passed off the operative field and sent to pathology, held pedicles secured with free tie of 0 Vicryl.  At this point in time, a Foley was put in place.  Bladder was  drained. Urine output was clear.  The Foley was then clamped.  We grasped the anterior vaginal cuff.  The vaginal mucosa was infiltrated with 1% Xylocaine with epinephrine 1:200,000.  We then began dissecting the vaginal mucosa from the underlying bladder.  We then incised the vaginal mucosa in the midline.  We continued dissecting the paracervical tissue from the overlying vaginal mucosa.  The pubocervical fascia was then brought into place in the midline using interrupted sutures of 2-0 chromic.  Therefore, obliterating the cystocele.  The vaginal edges were trimmed.  The vaginal mucosa was then reapproximated with interrupted sutures of 2-0 Vicryl.  We then got the remaining vaginal cuff.  The uterosacral plication stitch was put in place using 2-0 Vicryl.  The remaining vaginal cuff was closed with interrupted sutures of 2-0 Vicryl.  At this point in time, we turned to the mid urethral sling.  Mid urethra was identified and infiltrated with 1% Xylocaine with epinephrine and incision was made in the vaginal mucosa overlying the urethra.  Using sharp and blunt dissection, we dissected out to the obturator foramen on each side.  On the groin, an area was identified lateral to the inferior pubic ramus  below the abductus longus.  About the level of the clitoris, this area was infiltrated with 1% Xylocaine.  Incision was made with a knife.  Next, using the Obtryx Halo system, both needles were passed through the obturator foramen around the inferior pubic ramus and out to the vaginal mucosa on both sides.  There was no evidence that we buttonholed the vaginal mucosa.  Cystoscope was then inserted.  Visualization revealed no evidence of injury to the bladder or urethra.  The patient had been given indigo carmine, streams of blue tinged urine were noted coming from both ureteral orifices.  Cystoscope was then removed.  The polypropylene mesh was brought in place, secured to the needles and  brought out through the skin.  Blue tab was trimmed. We adjusted the mesh underneath the midurethra until lay flat, but we could easily insert a Fei and rotate it 90 degrees.  The plastic arms were removed.  Arms were trimmed at the level of the skin.  The vaginal mucosa was then closed running suture of 2-0 Vicryl.  The skin incisions were closed with Dermabond.  We then went to the posterior colporrhaphy and  incision was made in the skin on the peritoneum and was  dissected up to the vaginal opening. Using sharp and blunt dissection, the vaginal mucosa was separated from the underlying rectum and colon.  We then made an incision in the vaginal mucosa in the midline, further dissected any perirectal fascia from the overlying vaginal mucosa.  We went out laterally on the right side, identified the sacrospinous ligaments using the Vicryl, and Capio system.  It was brought through the right sacrospinous ligament.  This was done near the sacrum.  This was held.  We then brought together the perirectal fascia  in the midline with interrupted sutures of 2-0 Vicryl. We then secured the sacrospinous ligament suture to the vaginal cuff at its apex using the free Mayo needle.  We then began reapproximating the vaginal mucosa with sutures of 2-0 Vicryl.  We closed the vaginal opening.  Sacrospinous ligament suture was tied down with good elevation of the vaginal cuff.  Remaining vaginal mucosa closed with 2-0 Vicryl. Perineal body was rebuilt with 2-0 Vicryl, and skin was closed with running subcuticular 2-0 Vicryl.  We had good hemostasis and good support.  Foley was placed back to straight drain at this point.  Laparoscope was reintroduced.  Visualization revealed some oozing along the left pelvic sidewall, brought under control with the bipolar.  There was also some oozing from the vaginal cuff, brought under control with the bipolar.  We then deflated the abdomen and reinflated.  There was  no bleeding from either site for the ovaries removed or the vaginal cuff. At this point in time, the abdomen was completely deflated with carbon dioxide.  All trocars removed.  Subumbilical incision was closed with interrupted subcuticulars of 4-0 Vicryl.  Suprapubic incision was closed with Dermabond.  The vaginal pack was put in place using Kerlix.  Rectal exam was unremarkable.  The patient was taken out of the dorsal lithotomy position.  Sponge, instrument, and needle count was correct by circulating nurse multiple times.  Once extubated, the patient was transferred to recovery room in good condition.  Urine output was clear and adequate.     Juluis Mire, M.D.     JSM/MEDQ  D:  04/30/2011  T:  05/01/2011  Job:  [id number]

## 2011-05-02 NOTE — Discharge Summary (Signed)
[name], Audrey               ACCOUNT NO.:  [id number]  MEDICAL RECORD NO.:  [id number]  LOCATION:  9306                          FACILITY:  [facility]  PHYSICIAN:  Juluis Mire, M.D.   DATE OF BIRTH:  Nov 16, 1945  DATE OF ADMISSION:  04/30/2011 DATE OF DISCHARGE:  05/01/2011                              DISCHARGE SUMMARY   ADMITTING DIAGNOSIS:  Symptomatic pelvic relaxation with associated stress urinary incontinence.  DISCHARGE DIAGNOSIS:  Symptomatic pelvic relaxation with associated stress urinary incontinence.  OPERATIVE PROCEDURE:  Laparoscopic-assisted vaginal hysterectomy with bilateral salpingo-oophorectomy.  Anterior colporrhaphy.  Mid urethral sling using a transobturator approach.  Cystoscopy.  Posterior colporrhaphy.  Sacrospinous ligament suspension.  For complete history and physical, please see dictated note.  COURSE IN HOSPITAL:  The patient underwent the above-noted surgery.  She did well.  Postop hemoglobin was 9.7.  She was tolerating a regular diet at the time of discharge, and ambulating without difficulty.  Her abdomen was soft, nontender.  All incisions were clear.  Vaginal pack had been discontinued.  No active bleeding was noted.  She was doing a voiding trial.  She was emptying, but still had fairly large residuals. If this continues, we will reinsert the Foley and follow her up on Monday.  In terms of complications, none were encountered during stay in the hospital.  The patient discharged home in stable condition.  DISPOSITION:  The patient to avoid heavy lifting, vaginal entrance, or driving of a car.  She is to call with signs of infection.  She will report nausea and vomiting.  Any active bleeding should be reported. Excessive pain should also be reported.  Also instructed, signs and symptoms of deep venous thrombosis and pulmonary embolus.  Discharged home on Tylenol 3 as well as Colace.  Follow up in the office on Monday.     Juluis Mire, M.D.     JSM/MEDQ  D:  05/01/2011  T:  05/02/2011  Job:  [id number]

## 2012-01-20 ENCOUNTER — Ambulatory Visit (HOSPITAL_COMMUNITY)
Admission: RE | Admit: 2012-01-20 | Discharge: 2012-01-20 | Disposition: A | Discharge status: Home / Self Care | Payer: PRIVATE HEALTH INSURANCE | Source: Ambulatory Visit | Attending: Obstetrics and Gynecology | Admitting: Obstetrics and Gynecology

## 2012-01-20 ENCOUNTER — Other Ambulatory Visit (HOSPITAL_COMMUNITY): Payer: Self-pay | Admitting: Obstetrics and Gynecology

## 2012-01-20 DIAGNOSIS — K8020 Calculus of gallbladder without cholecystitis without obstruction: Secondary | ICD-10-CM | POA: Insufficient documentation

## 2012-01-20 DIAGNOSIS — Z09 Encounter for follow-up examination after completed treatment for conditions other than malignant neoplasm: Secondary | ICD-10-CM

## 2012-01-20 DIAGNOSIS — IMO0002 Reserved for concepts with insufficient information to code with codable children: Secondary | ICD-10-CM

## 2012-01-22 ENCOUNTER — Other Ambulatory Visit (HOSPITAL_COMMUNITY): Payer: Self-pay | Admitting: Obstetrics and Gynecology

## 2012-01-22 DIAGNOSIS — K8020 Calculus of gallbladder without cholecystitis without obstruction: Secondary | ICD-10-CM

## 2012-01-26 ENCOUNTER — Ambulatory Visit (HOSPITAL_COMMUNITY)
Admission: RE | Admit: 2012-01-26 | Discharge: 2012-01-26 | Disposition: A | Discharge status: Home / Self Care | Payer: PRIVATE HEALTH INSURANCE | Source: Ambulatory Visit | Attending: Obstetrics and Gynecology | Admitting: Obstetrics and Gynecology

## 2012-01-26 DIAGNOSIS — Q619 Cystic kidney disease, unspecified: Secondary | ICD-10-CM | POA: Insufficient documentation

## 2012-01-26 DIAGNOSIS — K8020 Calculus of gallbladder without cholecystitis without obstruction: Secondary | ICD-10-CM | POA: Insufficient documentation

## 2012-01-26 DIAGNOSIS — K7689 Other specified diseases of liver: Secondary | ICD-10-CM | POA: Insufficient documentation

## 2012-01-29 ENCOUNTER — Other Ambulatory Visit: Payer: Self-pay | Admitting: Obstetrics and Gynecology

## 2012-01-29 DIAGNOSIS — R935 Abnormal findings on diagnostic imaging of other abdominal regions, including retroperitoneum: Secondary | ICD-10-CM

## 2012-01-29 DIAGNOSIS — K862 Cyst of pancreas: Secondary | ICD-10-CM

## 2012-02-01 ENCOUNTER — Encounter (INDEPENDENT_AMBULATORY_CARE_PROVIDER_SITE_OTHER): Payer: Self-pay | Admitting: Surgery

## 2012-02-01 ENCOUNTER — Ambulatory Visit (INDEPENDENT_AMBULATORY_CARE_PROVIDER_SITE_OTHER): Payer: PRIVATE HEALTH INSURANCE | Admitting: Surgery

## 2012-02-01 VITALS — BP 124/82 | HR 72 | Temp 97.8°F | Resp 20 | Ht 60.0 in | Wt 139.6 lb

## 2012-02-01 DIAGNOSIS — K8020 Calculus of gallbladder without cholecystitis without obstruction: Secondary | ICD-10-CM | POA: Insufficient documentation

## 2012-02-01 DIAGNOSIS — K8689 Other specified diseases of pancreas: Secondary | ICD-10-CM | POA: Insufficient documentation

## 2012-02-01 DIAGNOSIS — K869 Disease of pancreas, unspecified: Secondary | ICD-10-CM

## 2012-02-01 NOTE — Patient Instructions (Signed)
NEED TO WAIT ON MRI  REPORT.  ONCE DONE NEXT STEP CAN BE DETERMINED.  PLEASE CALL OFFICE IF YOU HAVE NOT HEARD FROM [place] BY NEXT Tuesday.

## 2012-02-01 NOTE — Progress Notes (Signed)
Patient ID: Audrey Holland, female   DOB: [birth year], 66 y.o.   MRN: [id number]  Chief Complaint  Patient presents with  . New Evaluation    eval GB w/ stones    HPI Audrey Holland is a 66 y.o. female.  Patient sent request of Dr. Arelia Sneddon for gallstone and 1 cm cystic mass detected on ultrasound and pancreatic head. Patient denies any abdominal pain, vomiting, fatty food intolerance, jaundice, itching, or back pain. She has occasional nausea which she treats her medications but this is not severe nor life changing. Denies any right upper quadrant pain or fatty food intolerance. HPI  Past Medical History  Diagnosis Date  . Hypertension   . Hyperlipidemia     diet controlled  . Recurrent upper respiratory infection (URI)     Flu in Dec, Congestion/cough in Jan/Feb on abx  . Hyperthyroidism     borderline per pt, pt states has goiter, no meds  . Blood transfusion 1970's     2 units transfused in [place], [place]  . Arthritis     back pain, knee - no meds  . Anemia   . Blood transfusion without reported diagnosis   . Osteoporosis     Past Surgical History  Procedure Date  . Svd     x 8  . Colonoscopy 1/13  . Dilation and curettage of uterus     x 3  . Laparoscopic assisted vaginal hysterectomy 04/30/2011    Procedure: LAPAROSCOPIC ASSISTED VAGINAL HYSTERECTOMY;  Surgeon: Juluis Mire, MD;  Location: [facility];  Service: Gynecology;  Laterality: N/A;  . Salpingoophorectomy 04/30/2011    Procedure: SALPINGO OOPHERECTOMY;  Surgeon: Juluis Mire, MD;  Location: [facility];  Service: Gynecology;  Laterality: Bilateral;  . Anterior and posterior repair 04/30/2011    Procedure: ANTERIOR (CYSTOCELE) AND POSTERIOR REPAIR (RECTOCELE);  Surgeon: Juluis Mire, MD;  Location: [facility];  Service: Gynecology;  Laterality: N/A;  with Sacrospinous Ligament Suspension  . Bladder suspension 04/30/2011    Procedure: TRANSVAGINAL TAPE (TVT) PROCEDURE;  Surgeon: Juluis Mire, MD;  Location: [facility];   Service: Gynecology;  Laterality: N/A;  Transobturator Sling  . Cystoscopy 04/30/2011    Procedure: CYSTOSCOPY;  Surgeon: Juluis Mire, MD;  Location: [facility];  Service: Gynecology;  Laterality: N/A;    Family History  Problem Relation Age of Onset  . Diabetes Father   . Heart disease Father   . Stroke Father   . Cancer Maternal Aunt     breast   . Cancer Maternal Aunt     stomach    Social History History  Substance Use Topics  . Smoking status: Never Smoker   . Smokeless tobacco: Never Used  . Alcohol Use: No    No Known Allergies  Current Outpatient Prescriptions  Medication Sig Dispense Refill  . albuterol (PROVENTIL HFA;VENTOLIN HFA) 108 (90 BASE) MCG/ACT inhaler Inhale 2 puffs into the lungs every 4 (four) hours as needed.      Marland Kitchen azithromycin (ZITHROMAX) 500 MG tablet Take 500 mg by mouth daily.      . carvedilol (COREG) 6.25 MG tablet       . dextromethorphan-guaiFENesin (MUCINEX DM) 30-600 MG per 12 hr tablet Take 1 tablet by mouth every 12 (twelve) hours.      . hydrochlorothiazide (MICROZIDE) 12.5 MG capsule Take 12.5 mg by mouth daily.      Marland Kitchen lisinopril (PRINIVIL,ZESTRIL) 40 MG tablet Take 40 mg by mouth daily.      Marland Kitchen  losartan (COZAAR) 100 MG tablet Take 100 mg by mouth at bedtime.      Marland Kitchen Phenyleph-CPM-DM-APAP (ALKA-SELTZER PLUS COLD & COUGH) 07-08-08-325 MG CAPS Take 2 tablets by mouth 2 (two) times daily as needed. Takes when she feels like she is getting a cold      . vitamin C (ASCORBIC ACID) 500 MG tablet Take 1,000 mg by mouth daily.        Review of Systems Review of Systems  Constitutional: Negative for fever, chills and unexpected weight change.  HENT: Negative for hearing loss, congestion, sore throat, trouble swallowing and voice change.   Eyes: Negative for visual disturbance.  Respiratory: Negative for cough and wheezing.   Cardiovascular: Negative for chest pain, palpitations and leg swelling.  Gastrointestinal: Negative for nausea, vomiting,  abdominal pain, diarrhea, constipation, blood in stool, abdominal distention and anal bleeding.  Genitourinary: Negative for hematuria, vaginal bleeding and difficulty urinating.  Musculoskeletal: Negative for arthralgias.  Skin: Negative for rash and wound.  Neurological: Negative for seizures, syncope and headaches.  Hematological: Negative for adenopathy. Does not bruise/bleed easily.  Psychiatric/Behavioral: Negative for confusion.    Blood pressure 124/82, pulse 72, temperature 97.8 F (36.6 C), temperature source Temporal, resp. rate 20, height 5' (1.524 m), weight 139 lb 9.6 oz (63.322 kg).  Physical Exam Physical Exam  Constitutional: She is oriented to person, place, and time. She appears well-developed and well-nourished.  HENT:  Head: Normocephalic and atraumatic.  Eyes: EOM are normal. Pupils are equal, round, and reactive to light.  Neck: Normal range of motion. Neck supple.  Cardiovascular: Normal rate and regular rhythm.   Pulmonary/Chest: Effort normal and breath sounds normal.  Abdominal: Soft. Bowel sounds are normal. She exhibits no distension. There is no tenderness.  Musculoskeletal: Normal range of motion.  Neurological: She is alert and oriented to person, place, and time.  Skin: Skin is warm and dry.  Psychiatric: She has a normal mood and affect. Her behavior is normal. Judgment and thought content normal.    Data Reviewed Clinical Data: Cholelithiasis.  ABDOMINAL ULTRASOUND COMPLETE  Comparison: None.  Findings:  Gallbladder: A large gallstone is seen measuring approximately 2.4  cm. No evidence of gallbladder wall thickening or pericholecystic  fluid.  Common Bile Duct: Within normal limits in caliber. Measures 4 mm  in diameter.  Liver: Diffusely increased parenchymal echogenicity, consistent  with hepatic steatosis. No focal liver mass identified.  IVC: Appears normal.  Pancreas: A cystic focus is seen in the pancreatic head measuring  1.5 cm  which appears separate from the common bile and pancreatic  ducts. This could represent a small pseudocyst or cystic neoplasm.  Spleen: Within normal limits in size and echotexture.  Right kidney: Normal in size and parenchymal echogenicity. No  evidence of mass or hydronephrosis. A simple cyst noted in lower  pole measuring 2.5 cm.  Left kidney: Normal in size and parenchymal echogenicity. No  evidence of mass or hydronephrosis.  Abdominal Aorta: No aneurysm identified.  IMPRESSION:  1. Cholelithiasis. No sonographic signs of acute cholecystitis or  biliary dilatation.  2. Mild hepatic steatosis.  3. 1.5 cm cystic focus in the pancreatic head. Differential  diagnosis includes a small pancreatic pseudocyst or cystic  neoplasm. Recommend further imaging characterization with abdomen  MRI without and with contrast as the preferred exam. (CT would be  appropriate if patient has contraindication to MRI or cannot  cooperate with breath-holding.)    Assessment    Asymptomatic gallstones  Cystic mass pancreatic  head    Plan    Patient scheduled for MRI this week of pancreatic head mass. Her gallstones asymptomatic therefore surgeon I recommended at this time. She has been instructed to call our office next week once the MRI has been done and read so that subsequent care can be determined.       Antwan Pandya A. 02/01/2012, 9:51 AM

## 2012-02-02 ENCOUNTER — Other Ambulatory Visit: Payer: PRIVATE HEALTH INSURANCE

## 2012-02-10 ENCOUNTER — Ambulatory Visit
Admission: RE | Admit: 2012-02-10 | Discharge: 2012-02-10 | Disposition: A | Discharge status: Home / Self Care | Payer: PRIVATE HEALTH INSURANCE | Source: Ambulatory Visit | Attending: Obstetrics and Gynecology | Admitting: Obstetrics and Gynecology

## 2012-02-10 DIAGNOSIS — K862 Cyst of pancreas: Secondary | ICD-10-CM

## 2012-02-10 DIAGNOSIS — R935 Abnormal findings on diagnostic imaging of other abdominal regions, including retroperitoneum: Secondary | ICD-10-CM

## 2013-01-25 ENCOUNTER — Other Ambulatory Visit: Payer: Self-pay | Admitting: Obstetrics and Gynecology

## 2013-01-25 DIAGNOSIS — K862 Cyst of pancreas: Secondary | ICD-10-CM

## 2013-02-07 ENCOUNTER — Ambulatory Visit
Admission: RE | Admit: 2013-02-07 | Discharge: 2013-02-07 | Disposition: A | Discharge status: Home / Self Care | Payer: PRIVATE HEALTH INSURANCE | Source: Ambulatory Visit | Attending: Obstetrics and Gynecology | Admitting: Obstetrics and Gynecology

## 2013-02-07 DIAGNOSIS — K862 Cyst of pancreas: Secondary | ICD-10-CM

## 2013-02-07 MED ORDER — GADOBENATE DIMEGLUMINE 529 MG/ML IV SOLN
12.0000 mL | Freq: Once | INTRAVENOUS | Status: AC | PRN
  Administered 2013-02-07: 12 mL via INTRAVENOUS

## 2013-03-31 ENCOUNTER — Other Ambulatory Visit (HOSPITAL_COMMUNITY): Payer: Self-pay | Admitting: Obstetrics and Gynecology

## 2013-03-31 DIAGNOSIS — E041 Nontoxic single thyroid nodule: Secondary | ICD-10-CM

## 2013-04-05 ENCOUNTER — Ambulatory Visit (HOSPITAL_COMMUNITY)
Admission: RE | Admit: 2013-04-05 | Discharge: 2013-04-05 | Disposition: A | Discharge status: Home / Self Care | Payer: PRIVATE HEALTH INSURANCE | Source: Ambulatory Visit | Attending: Obstetrics and Gynecology | Admitting: Obstetrics and Gynecology

## 2013-04-05 DIAGNOSIS — E041 Nontoxic single thyroid nodule: Secondary | ICD-10-CM

## 2013-04-05 DIAGNOSIS — E042 Nontoxic multinodular goiter: Secondary | ICD-10-CM | POA: Insufficient documentation

## 2013-05-15 ENCOUNTER — Other Ambulatory Visit (HOSPITAL_COMMUNITY): Payer: Self-pay | Admitting: Endocrinology

## 2013-05-15 DIAGNOSIS — E049 Nontoxic goiter, unspecified: Secondary | ICD-10-CM

## 2013-05-29 ENCOUNTER — Encounter (HOSPITAL_COMMUNITY)
Admission: RE | Admit: 2013-05-29 | Discharge: 2013-05-29 | Disposition: A | Discharge status: Home / Self Care | Payer: PRIVATE HEALTH INSURANCE | Source: Ambulatory Visit | Attending: Endocrinology | Admitting: Endocrinology

## 2013-05-29 DIAGNOSIS — E049 Nontoxic goiter, unspecified: Secondary | ICD-10-CM | POA: Insufficient documentation

## 2013-05-30 ENCOUNTER — Encounter (HOSPITAL_COMMUNITY)
Admission: RE | Admit: 2013-05-30 | Discharge: 2013-05-30 | Disposition: A | Discharge status: Home / Self Care | Payer: PRIVATE HEALTH INSURANCE | Source: Ambulatory Visit | Attending: Endocrinology | Admitting: Endocrinology

## 2013-05-30 MED ORDER — SODIUM PERTECHNETATE TC 99M INJECTION
10.8000 | Freq: Once | INTRAVENOUS | Status: AC | PRN
  Administered 2013-05-30: 11 via INTRAVENOUS

## 2013-05-30 MED ORDER — SODIUM IODIDE I 131 CAPSULE: 8.7000 | Freq: Once | INTRAVENOUS | Status: AC | PRN

## 2013-06-12 ENCOUNTER — Other Ambulatory Visit: Payer: Self-pay | Admitting: Endocrinology

## 2013-06-12 DIAGNOSIS — E0590 Thyrotoxicosis, unspecified without thyrotoxic crisis or storm: Secondary | ICD-10-CM

## 2013-06-19 ENCOUNTER — Encounter (HOSPITAL_COMMUNITY): Payer: Self-pay

## 2013-06-19 ENCOUNTER — Encounter (HOSPITAL_COMMUNITY)
Admission: RE | Admit: 2013-06-19 | Discharge: 2013-06-19 | Disposition: A | Discharge status: Home / Self Care | Payer: PRIVATE HEALTH INSURANCE | Source: Ambulatory Visit | Attending: Endocrinology | Admitting: Endocrinology

## 2013-06-19 DIAGNOSIS — E0590 Thyrotoxicosis, unspecified without thyrotoxic crisis or storm: Secondary | ICD-10-CM | POA: Insufficient documentation

## 2013-06-19 MED ORDER — SODIUM IODIDE I 131 CAPSULE
32.1000 | Freq: Once | INTRAVENOUS | Status: AC | PRN
  Administered 2013-06-19: 32.1 via ORAL

## 2014-01-19 ENCOUNTER — Other Ambulatory Visit: Payer: Self-pay

## 2014-01-19 ENCOUNTER — Encounter (HOSPITAL_COMMUNITY): Payer: Self-pay | Admitting: *Deleted

## 2014-01-19 ENCOUNTER — Emergency Department (HOSPITAL_COMMUNITY): Payer: PRIVATE HEALTH INSURANCE

## 2014-01-19 ENCOUNTER — Emergency Department (HOSPITAL_COMMUNITY)
Admission: EM | Admit: 2014-01-19 | Discharge: 2014-01-20 | Disposition: A | Discharge status: Home / Self Care | Payer: PRIVATE HEALTH INSURANCE | Attending: Emergency Medicine | Admitting: Emergency Medicine

## 2014-01-19 DIAGNOSIS — J159 Unspecified bacterial pneumonia: Secondary | ICD-10-CM | POA: Insufficient documentation

## 2014-01-19 DIAGNOSIS — Z8639 Personal history of other endocrine, nutritional and metabolic disease: Secondary | ICD-10-CM | POA: Insufficient documentation

## 2014-01-19 DIAGNOSIS — M810 Age-related osteoporosis without current pathological fracture: Secondary | ICD-10-CM | POA: Diagnosis not present

## 2014-01-19 DIAGNOSIS — Z862 Personal history of diseases of the blood and blood-forming organs and certain disorders involving the immune mechanism: Secondary | ICD-10-CM | POA: Insufficient documentation

## 2014-01-19 DIAGNOSIS — Z79899 Other long term (current) drug therapy: Secondary | ICD-10-CM | POA: Diagnosis not present

## 2014-01-19 DIAGNOSIS — I10 Essential (primary) hypertension: Secondary | ICD-10-CM | POA: Insufficient documentation

## 2014-01-19 DIAGNOSIS — J189 Pneumonia, unspecified organism: Secondary | ICD-10-CM

## 2014-01-19 DIAGNOSIS — R05 Cough: Secondary | ICD-10-CM | POA: Diagnosis present

## 2014-01-19 DIAGNOSIS — R059 Cough, unspecified: Secondary | ICD-10-CM

## 2014-01-19 LAB — BASIC METABOLIC PANEL
ANION GAP: 15 (ref 5–15)
BUN: 7 mg/dL (ref 6–23)
CALCIUM: 9.3 mg/dL (ref 8.4–10.5)
CO2: 27 meq/L (ref 19–32)
Chloride: 95 mEq/L — ABNORMAL LOW (ref 96–112)
Creatinine, Ser: 0.73 mg/dL (ref 0.50–1.10)
GFR calc Af Amer: >90 mL/min (ref >90)
GFR calc non Af Amer: 87 mL/min — ABNORMAL LOW (ref >90)
Glucose, Bld: 98 mg/dL (ref 70–99)
Potassium: 3.3 mEq/L — ABNORMAL LOW (ref 3.7–5.3)
Sodium: 137 mEq/L (ref 137–147)

## 2014-01-19 LAB — CBC
HEMATOCRIT: 36.9 % (ref 36.0–46.0)
Hemoglobin: 12.3 g/dL (ref 12.0–15.0)
MCH: 28.1 pg (ref 26.0–34.0)
MCHC: 33.3 g/dL (ref 30.0–36.0)
MCV: 84.2 fL (ref 78.0–100.0)
Platelets: 196 10*3/uL (ref 150–400)
RBC: 4.38 MIL/uL (ref 3.87–5.11)
RDW: 12.6 % (ref 11.5–15.5)
WBC: 6.7 10*3/uL (ref 4.0–10.5)

## 2014-01-19 LAB — PRO B NATRIURETIC PEPTIDE: Pro B Natriuretic peptide (BNP): 39 pg/mL (ref 0–125)

## 2014-01-19 LAB — I-STAT TROPONIN, ED: TROPONIN I, POC: 0 ng/mL (ref 0.00–0.08)

## 2014-01-19 MED ORDER — AZITHROMYCIN 250 MG PO TABS
500.0000 mg | ORAL_TABLET | Freq: Once | ORAL | Status: AC
  Administered 2014-01-19: 500 mg via ORAL
  Filled 2014-01-19: qty 2

## 2014-01-19 MED ORDER — ALBUTEROL SULFATE HFA 108 (90 BASE) MCG/ACT IN AERS
2.0000 | INHALATION_SPRAY | Freq: Once | RESPIRATORY_TRACT | Status: AC
  Administered 2014-01-19: 2 via RESPIRATORY_TRACT
  Filled 2014-01-19: qty 6.7

## 2014-01-19 MED ORDER — AZITHROMYCIN 250 MG PO TABS: 250.0000 mg | ORAL_TABLET | Freq: Every day | ORAL | Status: DC

## 2014-01-19 MED ORDER — SODIUM CHLORIDE 0.9 % IV BOLUS (SEPSIS)
1000.0000 mL | Freq: Once | INTRAVENOUS | Status: AC
  Administered 2014-01-19: 1000 mL via INTRAVENOUS

## 2014-01-19 MED ORDER — HYDROCOD POLST-CHLORPHEN POLST 10-8 MG/5ML PO LQCR: 5.0000 mL | Freq: Two times a day (BID) | ORAL | Status: AC | PRN

## 2014-01-19 NOTE — ED Provider Notes (Signed)
CSN: [id number]     Arrival date & time 01/19/14  1836 History   First MD Initiated Contact with Patient 01/19/14 2139     Chief Complaint  Patient presents with  . Fever  . Cough     (Consider location/radiation/quality/duration/timing/severity/associated sxs/prior Treatment) HPI Comments: The patient is a 68 y/o female with past medical history of HTN, anemia, and recurrent URIs presents to the Emergency Department from an urgent care center in [place] with concerns for pneumonia. She states that she has been experiencing productive cough and  fever (Tmax=101.9) for the past 5 days.  She has tried Tylenol, Copywriter, advertising, and Airborne with mild relief. She went to urgent care earlier today where a CXR was obtained that showed pneumonia. She states that the urgent care provider was concerned about the consolidation on the x-ray and wanted her to be evaluated here in the ED.Associated symptoms include SOB on exertion, chest tightness, and nausea. She denies abdominal pain or vomiting.   Patient is a 68 y.o. female presenting with fever and cough. The history is provided by the patient.  Fever Associated symptoms: cough and nausea   Cough Associated symptoms: fever and shortness of breath     Past Medical History  Diagnosis Date  . Hypertension   . Hyperlipidemia     diet controlled  . Recurrent upper respiratory infection (URI)     Flu in Dec, Congestion/cough in Jan/Feb on abx  . Hyperthyroidism     borderline per pt, pt states has goiter, no meds  . Blood transfusion 1970's     2 units transfused in [place], [place]  . Arthritis     back pain, knee - no meds  . Anemia   . Blood transfusion without reported diagnosis   . Osteoporosis    Past Surgical History  Procedure Laterality Date  . Svd      x 8  . Colonoscopy  1/13  . Dilation and curettage of uterus      x 3  . Laparoscopic assisted vaginal hysterectomy  04/30/2011    Procedure: LAPAROSCOPIC ASSISTED VAGINAL  HYSTERECTOMY;  Surgeon: Darlyn Chamber, MD;  Location: [facility];  Service: Gynecology;  Laterality: N/A;  . Salpingoophorectomy  04/30/2011    Procedure: SALPINGO OOPHERECTOMY;  Surgeon: Darlyn Chamber, MD;  Location: [facility];  Service: Gynecology;  Laterality: Bilateral;  . Anterior and posterior repair  04/30/2011    Procedure: ANTERIOR (CYSTOCELE) AND POSTERIOR REPAIR (RECTOCELE);  Surgeon: Darlyn Chamber, MD;  Location: [facility];  Service: Gynecology;  Laterality: N/A;  with Sacrospinous Ligament Suspension  . Bladder suspension  04/30/2011    Procedure: TRANSVAGINAL TAPE (TVT) PROCEDURE;  Surgeon: Darlyn Chamber, MD;  Location: [facility];  Service: Gynecology;  Laterality: N/A;  Transobturator Sling  . Cystoscopy  04/30/2011    Procedure: CYSTOSCOPY;  Surgeon: Darlyn Chamber, MD;  Location: [facility];  Service: Gynecology;  Laterality: N/A;   Family History  Problem Relation Age of Onset  . Diabetes Father   . Heart disease Father   . Stroke Father   . Cancer Maternal Aunt     breast   . Cancer Maternal Aunt     stomach   History  Substance Use Topics  . Smoking status: Never Smoker   . Smokeless tobacco: Never Used  . Alcohol Use: No   OB History    No data available     Review of Systems  Constitutional: Positive for fever.  Respiratory:  Positive for cough, chest tightness and shortness of breath.   Gastrointestinal: Positive for nausea.  All other systems reviewed and are negative.     Allergies  Review of patient's allergies indicates no known allergies.  Home Medications   Prior to Admission medications   Medication Sig Start Date End Date Taking? Authorizing Provider  albuterol (PROVENTIL HFA;VENTOLIN HFA) 108 (90 BASE) MCG/ACT inhaler Inhale 2 puffs into the lungs every 4 (four) hours as needed for wheezing or shortness of breath.    Yes Historical Provider, MD  carvedilol (COREG) 6.25 MG tablet Take 6.25 mg by mouth 2 (two) times daily with a meal.  01/13/12  Yes Historical  Provider, MD  dextromethorphan-guaiFENesin (MUCINEX DM) 30-600 MG per 12 hr tablet Take 1 tablet by mouth every 12 (twelve) hours.   Yes Historical Provider, MD  hydrochlorothiazide (MICROZIDE) 12.5 MG capsule Take 12.5 mg by mouth daily.   Yes Historical Provider, MD  lisinopril (PRINIVIL,ZESTRIL) 40 MG tablet Take 40 mg by mouth daily.   Yes Historical Provider, MD  losartan (COZAAR) 100 MG tablet Take 100 mg by mouth at bedtime.   Yes Historical Provider, MD  Phenyleph-CPM-DM-APAP (ALKA-SELTZER PLUS COLD & COUGH) 07-08-08-325 MG CAPS Take 2 tablets by mouth 2 (two) times daily as needed. Takes when she feels like she is getting a cold   Yes Historical Provider, MD  vitamin C (ASCORBIC ACID) 500 MG tablet Take 1,000 mg by mouth daily.   Yes Historical Provider, MD  azithromycin (ZITHROMAX) 250 MG tablet Take 1 tablet (250 mg total) by mouth daily. Take 1 tablet PO daily. 01/19/14   Carman Ching, PA-C  chlorpheniramine-HYDROcodone (TUSSIONEX PENNKINETIC ER) 10-8 MG/5ML LQCR Take 5 mLs by mouth every 12 (twelve) hours as needed for cough. 01/19/14   Somaya Grassi M Kymber Kosar, PA-C   BP 135/63 mmHg  Pulse 73  Temp(Src) 98.5 F (36.9 C)  Resp 17  SpO2 98% Physical Exam  Constitutional: She is oriented to person, place, and time. She appears well-developed and well-nourished. No distress.  HENT:  Head: Normocephalic and atraumatic.  Mouth/Throat: Oropharynx is clear and moist.  Eyes: Conjunctivae and EOM are normal. Pupils are equal, round, and reactive to light.  Neck: Normal range of motion. Neck supple. No JVD present.  Cardiovascular: Normal rate, regular rhythm, normal heart sounds and intact distal pulses.   No extremity edema.  Pulmonary/Chest: Effort normal and breath sounds normal. No respiratory distress. She has no decreased breath sounds. She has no wheezes. She has no rhonchi. She has no rales.  Abdominal: Soft. Bowel sounds are normal. There is no tenderness.  Musculoskeletal: Normal range of  motion. She exhibits no edema.  Neurological: She is alert and oriented to person, place, and time. She has normal strength. No sensory deficit.  Speech fluent, goal oriented. Moves limbs without ataxia. Equal grip strength bilateral.  Skin: Skin is warm and dry. She is not diaphoretic.  Psychiatric: She has a normal mood and affect. Her behavior is normal.  Nursing note and vitals reviewed.   ED Course  Procedures (including critical care time) Labs Review Labs Reviewed  BASIC METABOLIC PANEL - Abnormal; Notable for the following:    Potassium 3.3 (*)    Chloride 95 (*)    GFR calc non Af Amer 87 (*)    All other components within normal limits  CBC  PRO B NATRIURETIC PEPTIDE  I-STAT TROPOININ, ED    Imaging Review Dg Chest 2 View  01/19/2014   CLINICAL  DATA:  Subsequent evaluation for cough  EXAM: CHEST  2 VIEW  COMPARISON:  01/19/2014  FINDINGS: Heart size and vascular pattern are normal. Lungs clear. No pleural effusions.  Focus of consolidation in the inferior aspect of the right upper lobe, slightly larger when compared to study performed earlier today.  IMPRESSION: Right upper lobe pneumonia. Recommend radiographic follow-up to ensure resolution.   Electronically Signed   By: Skipper Cliche M.D.   On: 01/19/2014 22:48   Dg Outside Films Chest  01/19/2014   This examination belongs to an outside facility and is stored  here for comparison purposes only.  Contact the originating outside  institution for any associated report or interpretation.    EKG Interpretation   Date/Time:  Friday January 19 2014 18:44:39 EST Ventricular Rate:  70 PR Interval:  152 QRS Duration: 86 QT Interval:  388 QTC Calculation: 419 R Axis:   47 Text Interpretation:  Normal sinus rhythm Nonspecific ST abnormality  Abnormal ECG nonspecific changes new compared to 2013 Confirmed by  [place]  MD, [facility] [place] ([id number]) on 01/19/2014 10:23:41 PM      MDM   Final diagnoses:  CAP (community  acquired pneumonia)   Patient presenting from urgent care with concerns of pneumonia. She is nontoxic appearing and in no apparent distress. Afebrile, vital signs stable. Unable to obtain x-ray read from urgent care. Repeat chest x-ray showing right upper lobe pneumonia. On exam, lungs clear. She does have a cough. This is a community-acquired pneumonia. Patient was able to ambulate around the emergency department with O2 sat remaining above 95% on room air. She is stable for discharge home with follow-up with her PCP. Return precautions given. Patient states understanding of treatment care plan and is agreeable.  Discussed with attending Dr. Regenia Skeeter who also evaluated patient and agrees with plan of care.   Carman Ching, PA-C 01/19/14 [facility], MD 01/20/14 [phone]

## 2014-01-19 NOTE — ED Notes (Signed)
PA at bedside.

## 2014-01-19 NOTE — Discharge Instructions (Signed)
Take azithromycin once daily for the next 4 days beginning tomorrow as you were given the first dose in the emergency department today. Use albuterol inhaler every 4-6 hours as needed for cough and wheezing. Take Tussionex for severe cough. No driving or operating heavy machinery while taking Tussionex as it may cause drowsiness. Follow-up with your primary care physician within one week for repeat chest x-ray.  Pneumonia Pneumonia is an infection of the lungs.  CAUSES Pneumonia may be caused by bacteria or a virus. Usually, these infections are caused by breathing infectious particles into the lungs (respiratory tract). SIGNS AND SYMPTOMS   Cough.  Fever.  Chest pain.  Increased rate of breathing.  Wheezing.  Mucus production. DIAGNOSIS  If you have the common symptoms of pneumonia, your health care provider will typically confirm the diagnosis with a chest X-ray. The X-ray will show an abnormality in the lung (pulmonary infiltrate) if you have pneumonia. Other tests of your blood, urine, or sputum may be done to find the specific cause of your pneumonia. Your health care provider may also do tests (blood gases or pulse oximetry) to see how well your lungs are working. TREATMENT  Some forms of pneumonia may be spread to other people when you cough or sneeze. You may be asked to wear a mask before and during your exam. Pneumonia that is caused by bacteria is treated with antibiotic medicine. Pneumonia that is caused by the influenza virus may be treated with an antiviral medicine. Most other viral infections must run their course. These infections will not respond to antibiotics.  HOME CARE INSTRUCTIONS   Cough suppressants may be used if you are losing too much rest. However, coughing protects you by clearing your lungs. You should avoid using cough suppressants if you can.  Your health care provider may have prescribed medicine if he or she thinks your pneumonia is caused by bacteria or  influenza. Finish your medicine even if you start to feel better.  Your health care provider may also prescribe an expectorant. This loosens the mucus to be coughed up.  Take medicines only as directed by your health care provider.  Do not smoke. Smoking is a common cause of bronchitis and can contribute to pneumonia. If you are a smoker and continue to smoke, your cough may last several weeks after your pneumonia has cleared.  A cold steam vaporizer or humidifier in your room or home may help loosen mucus.  Coughing is often worse at night. Sleeping in a semi-upright position in a recliner or using a couple pillows under your head will help with this.  Get rest as you feel it is needed. Your body will usually let you know when you need to rest. PREVENTION A pneumococcal shot (vaccine) is available to prevent a common bacterial cause of pneumonia. This is usually suggested for:  People over 49 years old.  Patients on chemotherapy.  People with chronic lung problems, such as bronchitis or emphysema.  People with immune system problems. If you are over 65 or have a high risk condition, you may receive the pneumococcal vaccine if you have not received it before. In some countries, a routine influenza vaccine is also recommended. This vaccine can help prevent some cases of pneumonia.You may be offered the influenza vaccine as part of your care. If you smoke, it is time to quit. You may receive instructions on how to stop smoking. Your health care provider can provide medicines and counseling to help you quit. SEEK  MEDICAL CARE IF: You have a fever. SEEK IMMEDIATE MEDICAL CARE IF:   Your illness becomes worse. This is especially true if you are elderly or weakened from any other disease.  You cannot control your cough with suppressants and are losing sleep.  You begin coughing up blood.  You develop pain which is getting worse or is uncontrolled with medicines.  Any of the symptoms  which initially brought you in for treatment are getting worse rather than better.  You develop shortness of breath or chest pain. MAKE SURE YOU:   Understand these instructions.  Will watch your condition.  Will get help right away if you are not doing well or get worse. Document Released: 02/23/2005 Document Revised: 07/10/2013 Document Reviewed: 05/15/2010 [facility] Patient Information 2015 [place], [place]. This information is not intended to replace advice given to you by your health care provider. Make sure you discuss any questions you have with your health care provider.

## 2014-01-19 NOTE — ED Notes (Addendum)
Pt reports going to ucc today due to low grade fever, productive cough and chest discomfort for days. Pt had xray done there and diagnosed with pneumonia, sent here for further eval. Airway intact at triage, no acute distress noted.

## 2014-01-19 NOTE — ED Notes (Signed)
Pt was ambulated with little assistance for finger pulse oximetry test. Pt started at 98% with a pulse of 82. After roughly 50 yards of walking. Pt dropped to 95% with a pulse of 87. Pt does complain of weakness and lightheadedness

## 2014-01-19 NOTE — ED Notes (Signed)
Patient transported to X-ray 

## 2014-10-12 ENCOUNTER — Encounter (HOSPITAL_COMMUNITY): Payer: Self-pay | Admitting: Emergency Medicine

## 2014-10-12 ENCOUNTER — Emergency Department (HOSPITAL_COMMUNITY): Payer: PRIVATE HEALTH INSURANCE

## 2014-10-12 ENCOUNTER — Emergency Department (HOSPITAL_COMMUNITY)
Admission: EM | Admit: 2014-10-12 | Discharge: 2014-10-12 | Disposition: A | Discharge status: Home / Self Care | Payer: PRIVATE HEALTH INSURANCE | Attending: Emergency Medicine | Admitting: Emergency Medicine

## 2014-10-12 DIAGNOSIS — R0602 Shortness of breath: Secondary | ICD-10-CM | POA: Insufficient documentation

## 2014-10-12 DIAGNOSIS — M1990 Unspecified osteoarthritis, unspecified site: Secondary | ICD-10-CM | POA: Diagnosis not present

## 2014-10-12 DIAGNOSIS — I10 Essential (primary) hypertension: Secondary | ICD-10-CM | POA: Diagnosis not present

## 2014-10-12 DIAGNOSIS — R0789 Other chest pain: Secondary | ICD-10-CM | POA: Insufficient documentation

## 2014-10-12 DIAGNOSIS — Z862 Personal history of diseases of the blood and blood-forming organs and certain disorders involving the immune mechanism: Secondary | ICD-10-CM | POA: Diagnosis not present

## 2014-10-12 DIAGNOSIS — Z79899 Other long term (current) drug therapy: Secondary | ICD-10-CM | POA: Insufficient documentation

## 2014-10-12 DIAGNOSIS — Z8639 Personal history of other endocrine, nutritional and metabolic disease: Secondary | ICD-10-CM | POA: Insufficient documentation

## 2014-10-12 DIAGNOSIS — Z792 Long term (current) use of antibiotics: Secondary | ICD-10-CM | POA: Diagnosis not present

## 2014-10-12 LAB — BASIC METABOLIC PANEL
Anion gap: 9 (ref 5–15)
BUN: 6 mg/dL (ref 6–20)
CALCIUM: 9.3 mg/dL (ref 8.9–10.3)
CO2: 29 mmol/L (ref 22–32)
Chloride: 101 mmol/L (ref 101–111)
Creatinine, Ser: 0.78 mg/dL (ref 0.44–1.00)
GFR calc Af Amer: >60 mL/min (ref >60)
GFR calc non Af Amer: >60 mL/min (ref >60)
Glucose, Bld: 90 mg/dL (ref 65–99)
POTASSIUM: 3.4 mmol/L — AB (ref 3.5–5.1)
SODIUM: 139 mmol/L (ref 135–145)

## 2014-10-12 LAB — BRAIN NATRIURETIC PEPTIDE: B NATRIURETIC PEPTIDE 5: 19 pg/mL (ref 0.0–100.0)

## 2014-10-12 LAB — CBC WITH DIFFERENTIAL/PLATELET
Basophils Absolute: 0 10*3/uL (ref 0.0–0.1)
Basophils Relative: 0 % (ref 0–1)
EOS ABS: 0.4 10*3/uL (ref 0.0–0.7)
Eosinophils Relative: 7 % — ABNORMAL HIGH (ref 0–5)
HCT: 38.2 % (ref 36.0–46.0)
HEMOGLOBIN: 13 g/dL (ref 12.0–15.0)
LYMPHS PCT: 32 % (ref 12–46)
Lymphs Abs: 1.8 10*3/uL (ref 0.7–4.0)
MCH: 28.9 pg (ref 26.0–34.0)
MCHC: 34 g/dL (ref 30.0–36.0)
MCV: 84.9 fL (ref 78.0–100.0)
Monocytes Absolute: 0.3 10*3/uL (ref 0.1–1.0)
Monocytes Relative: 5 % (ref 3–12)
NEUTROS ABS: 3.1 10*3/uL (ref 1.7–7.7)
NEUTROS PCT: 56 % (ref 43–77)
Platelets: 182 10*3/uL (ref 150–400)
RBC: 4.5 MIL/uL (ref 3.87–5.11)
RDW: 13.1 % (ref 11.5–15.5)
WBC: 5.5 10*3/uL (ref 4.0–10.5)

## 2014-10-12 LAB — I-STAT TROPONIN, ED: Troponin i, poc: 0 ng/mL (ref 0.00–0.08)

## 2014-10-12 LAB — D-DIMER, QUANTITATIVE (NOT AT ARMC): D DIMER QUANT: 0.33 ug{FEU}/mL (ref 0.00–0.48)

## 2014-10-12 MED ORDER — GI COCKTAIL ~~LOC~~
30.0000 mL | Freq: Once | ORAL | Status: AC
  Administered 2014-10-12: 30 mL via ORAL
  Filled 2014-10-12: qty 30

## 2014-10-12 MED ORDER — ALBUTEROL SULFATE HFA 108 (90 BASE) MCG/ACT IN AERS: 2.0000 | INHALATION_SPRAY | RESPIRATORY_TRACT | Status: AC | PRN

## 2014-10-12 MED ORDER — IPRATROPIUM-ALBUTEROL 0.5-2.5 (3) MG/3ML IN SOLN
3.0000 mL | Freq: Once | RESPIRATORY_TRACT | Status: AC
  Administered 2014-10-12: 3 mL via RESPIRATORY_TRACT
  Filled 2014-10-12: qty 3

## 2014-10-12 MED ORDER — DEXAMETHASONE SODIUM PHOSPHATE 10 MG/ML IJ SOLN
10.0000 mg | Freq: Once | INTRAMUSCULAR | Status: AC
  Administered 2014-10-12: 10 mg via INTRAMUSCULAR
  Filled 2014-10-12: qty 1

## 2014-10-12 NOTE — ED Notes (Signed)
Pt sts SOB with congestion and productive cough x 3 weeks worse over last week; pt sts unable to lay flat and having chest tightness

## 2014-10-12 NOTE — ED Provider Notes (Signed)
CSN: [id number]     Arrival date & time 10/12/14  1029 History   First MD Initiated Contact with Patient 10/12/14 1114     Chief Complaint  Patient presents with  . Shortness of Breath     (Consider location/radiation/quality/duration/timing/severity/associated sxs/prior Treatment) HPI Comments: Patient with history of hypertension, hyperlipidemia, and GERD presents to the emergency department with chief complaint of shortness of breath, and chest tightness which is been gradually worsening over the past 3 weeks. She states that it became significantly worse yesterday. She states that her symptoms are worsened at night, and that she feels an extreme amount of chest tightness when she lies down at night. She states that she will wake up in the middle of the night coughing up phlegm. She denies any associated fevers, or chills. She has been being treated by her primary care doctor for acid reflux and is taking Protonix. She has noticed improvement of her symptoms with this, but there has not been resolution. She denies any history of ACS, PE, or DVT. She had a recent negative stress test.  The history is provided by the patient. No language interpreter was used.    Past Medical History  Diagnosis Date  . Hypertension   . Hyperlipidemia     diet controlled  . Recurrent upper respiratory infection (URI)     Flu in Dec, Congestion/cough in Jan/Feb on abx  . Hyperthyroidism     borderline per pt, pt states has goiter, no meds  . Blood transfusion 1970's     2 units transfused in [place], [place]  . Arthritis     back pain, knee - no meds  . Anemia   . Blood transfusion without reported diagnosis   . Osteoporosis    Past Surgical History  Procedure Laterality Date  . Svd      x 8  . Colonoscopy  1/13  . Dilation and curettage of uterus      x 3  . Laparoscopic assisted vaginal hysterectomy  04/30/2011    Procedure: LAPAROSCOPIC ASSISTED VAGINAL HYSTERECTOMY;  Surgeon: Darlyn Chamber, MD;  Location: [facility];  Service: Gynecology;  Laterality: N/A;  . Salpingoophorectomy  04/30/2011    Procedure: SALPINGO OOPHERECTOMY;  Surgeon: Darlyn Chamber, MD;  Location: [facility];  Service: Gynecology;  Laterality: Bilateral;  . Anterior and posterior repair  04/30/2011    Procedure: ANTERIOR (CYSTOCELE) AND POSTERIOR REPAIR (RECTOCELE);  Surgeon: Darlyn Chamber, MD;  Location: [facility];  Service: Gynecology;  Laterality: N/A;  with Sacrospinous Ligament Suspension  . Bladder suspension  04/30/2011    Procedure: TRANSVAGINAL TAPE (TVT) PROCEDURE;  Surgeon: Darlyn Chamber, MD;  Location: [facility];  Service: Gynecology;  Laterality: N/A;  Transobturator Sling  . Cystoscopy  04/30/2011    Procedure: CYSTOSCOPY;  Surgeon: Darlyn Chamber, MD;  Location: [facility];  Service: Gynecology;  Laterality: N/A;   Family History  Problem Relation Age of Onset  . Diabetes Father   . Heart disease Father   . Stroke Father   . Cancer Maternal Aunt     breast   . Cancer Maternal Aunt     stomach   History  Substance Use Topics  . Smoking status: Never Smoker   . Smokeless tobacco: Never Used  . Alcohol Use: No   OB History    No data available     Review of Systems  Constitutional: Negative for fever and chills.  Respiratory: Positive for chest tightness and  shortness of breath.   Cardiovascular: Negative for chest pain.  Gastrointestinal: Negative for nausea, vomiting, diarrhea and constipation.  Genitourinary: Negative for dysuria.  All other systems reviewed and are negative.     Allergies  Review of patient's allergies indicates no known allergies.  Home Medications   Prior to Admission medications   Medication Sig Start Date End Date Taking? Authorizing Provider  albuterol (PROVENTIL HFA;VENTOLIN HFA) 108 (90 BASE) MCG/ACT inhaler Inhale 2 puffs into the lungs every 4 (four) hours as needed for wheezing or shortness of breath.     Historical Provider, MD  azithromycin (ZITHROMAX) 250  MG tablet Take 1 tablet (250 mg total) by mouth daily. Take 1 tablet PO daily. 01/19/14   Carman Ching, PA-C  carvedilol (COREG) 6.25 MG tablet Take 6.25 mg by mouth 2 (two) times daily with a meal.  01/13/12   Historical Provider, MD  chlorpheniramine-HYDROcodone (TUSSIONEX PENNKINETIC ER) 10-8 MG/5ML LQCR Take 5 mLs by mouth every 12 (twelve) hours as needed for cough. 01/19/14   Robyn M Hess, PA-C  dextromethorphan-guaiFENesin (MUCINEX DM) 30-600 MG per 12 hr tablet Take 1 tablet by mouth every 12 (twelve) hours.    Historical Provider, MD  hydrochlorothiazide (MICROZIDE) 12.5 MG capsule Take 12.5 mg by mouth daily.    Historical Provider, MD  lisinopril (PRINIVIL,ZESTRIL) 40 MG tablet Take 40 mg by mouth daily.    Historical Provider, MD  losartan (COZAAR) 100 MG tablet Take 100 mg by mouth at bedtime.    Historical Provider, MD  Phenyleph-CPM-DM-APAP (ALKA-SELTZER PLUS COLD & COUGH) 07-08-08-325 MG CAPS Take 2 tablets by mouth 2 (two) times daily as needed. Takes when she feels like she is getting a cold    Historical Provider, MD  vitamin C (ASCORBIC ACID) 500 MG tablet Take 1,000 mg by mouth daily.    Historical Provider, MD   BP 181/92 mmHg  Pulse 81  Temp(Src) 97.6 F (36.4 C) (Oral)  Resp 18  SpO2 99% Physical Exam  Constitutional: She is oriented to person, place, and time. She appears well-developed and well-nourished.  HENT:  Head: Normocephalic and atraumatic.  Eyes: Conjunctivae and EOM are normal. Pupils are equal, round, and reactive to light.  Neck: Normal range of motion. Neck supple.  Cardiovascular: Normal rate and regular rhythm.  Exam reveals no gallop and no friction rub.   No murmur heard. Pulmonary/Chest: Effort normal and breath sounds normal. No respiratory distress. She has no wheezes. She has no rales. She exhibits no tenderness.  Abdominal: Soft. Bowel sounds are normal. She exhibits no distension and no mass. There is no tenderness. There is no rebound and no  guarding.  Musculoskeletal: Normal range of motion. She exhibits no edema or tenderness.  Neurological: She is alert and oriented to person, place, and time.  Skin: Skin is warm and dry.  Psychiatric: She has a normal mood and affect. Her behavior is normal. Judgment and thought content normal.  Nursing note and vitals reviewed.   ED Course  Procedures (including critical care time) Labs Review Labs Reviewed  BASIC METABOLIC PANEL - Abnormal; Notable for the following:    Potassium 3.4 (*)    All other components within normal limits  CBC WITH DIFFERENTIAL/PLATELET - Abnormal; Notable for the following:    Eosinophils Relative 7 (*)    All other components within normal limits  I-STAT TROPOININ, ED    Imaging Review Dg Chest 2 View  10/12/2014   CLINICAL DATA:  Chest heaviness for 1  week.  Shortness of breath.  EXAM: CHEST  2 VIEW  COMPARISON:  02/27/2014  FINDINGS: The heart size and mediastinal contours are within normal limits. Both lungs are clear. The visualized skeletal structures are unremarkable.  IMPRESSION: No active cardiopulmonary disease.   Electronically Signed   By: Rolm Baptise M.D.   On: 10/12/2014 11:01     EKG Interpretation None      MDM   Final diagnoses:  SOB (shortness of breath)    Patient with chest tightness and shortness of breath, gradually progressing over the past 3 weeks. Has been seen by primary care provider, symptoms thought to be secondary to acid reflux. Patient had a recent normal stress test. Patient wants to be assessed for pneumonia. Will check basic labs, will reassess.  Chest x-ray is negative for acute infiltrate. Troponin is negative. Single troponin sufficient, as patient's symptoms have been persistent for the past 3 weeks. D-dimer is negative. Highly doubt PE. BNP is normal. No evidence of congestive heart failure. Remaining labs are reassuring.  Patient seen by and discussed with Dr. Colin Rhein, who recommends treating with GI  cocktail, decadron, and duoneb.  Patient may then be discharged home to follow-up with primary care.    Montine Circle, PA-C 10/12/14 [place], MD 10/13/14 [phone]

## 2014-10-12 NOTE — Discharge Instructions (Signed)

## 2014-10-12 NOTE — ED Notes (Signed)
[place], MD at bedside.

## 2014-12-02 IMAGING — CR DG CHEST 2V
2 series · 2 of 2 positions shown · non-contrast
Comparison: 01/19/2014

CLINICAL DATA: Subsequent evaluation for cough

EXAM:
CHEST  2 VIEW

[w chest pa]
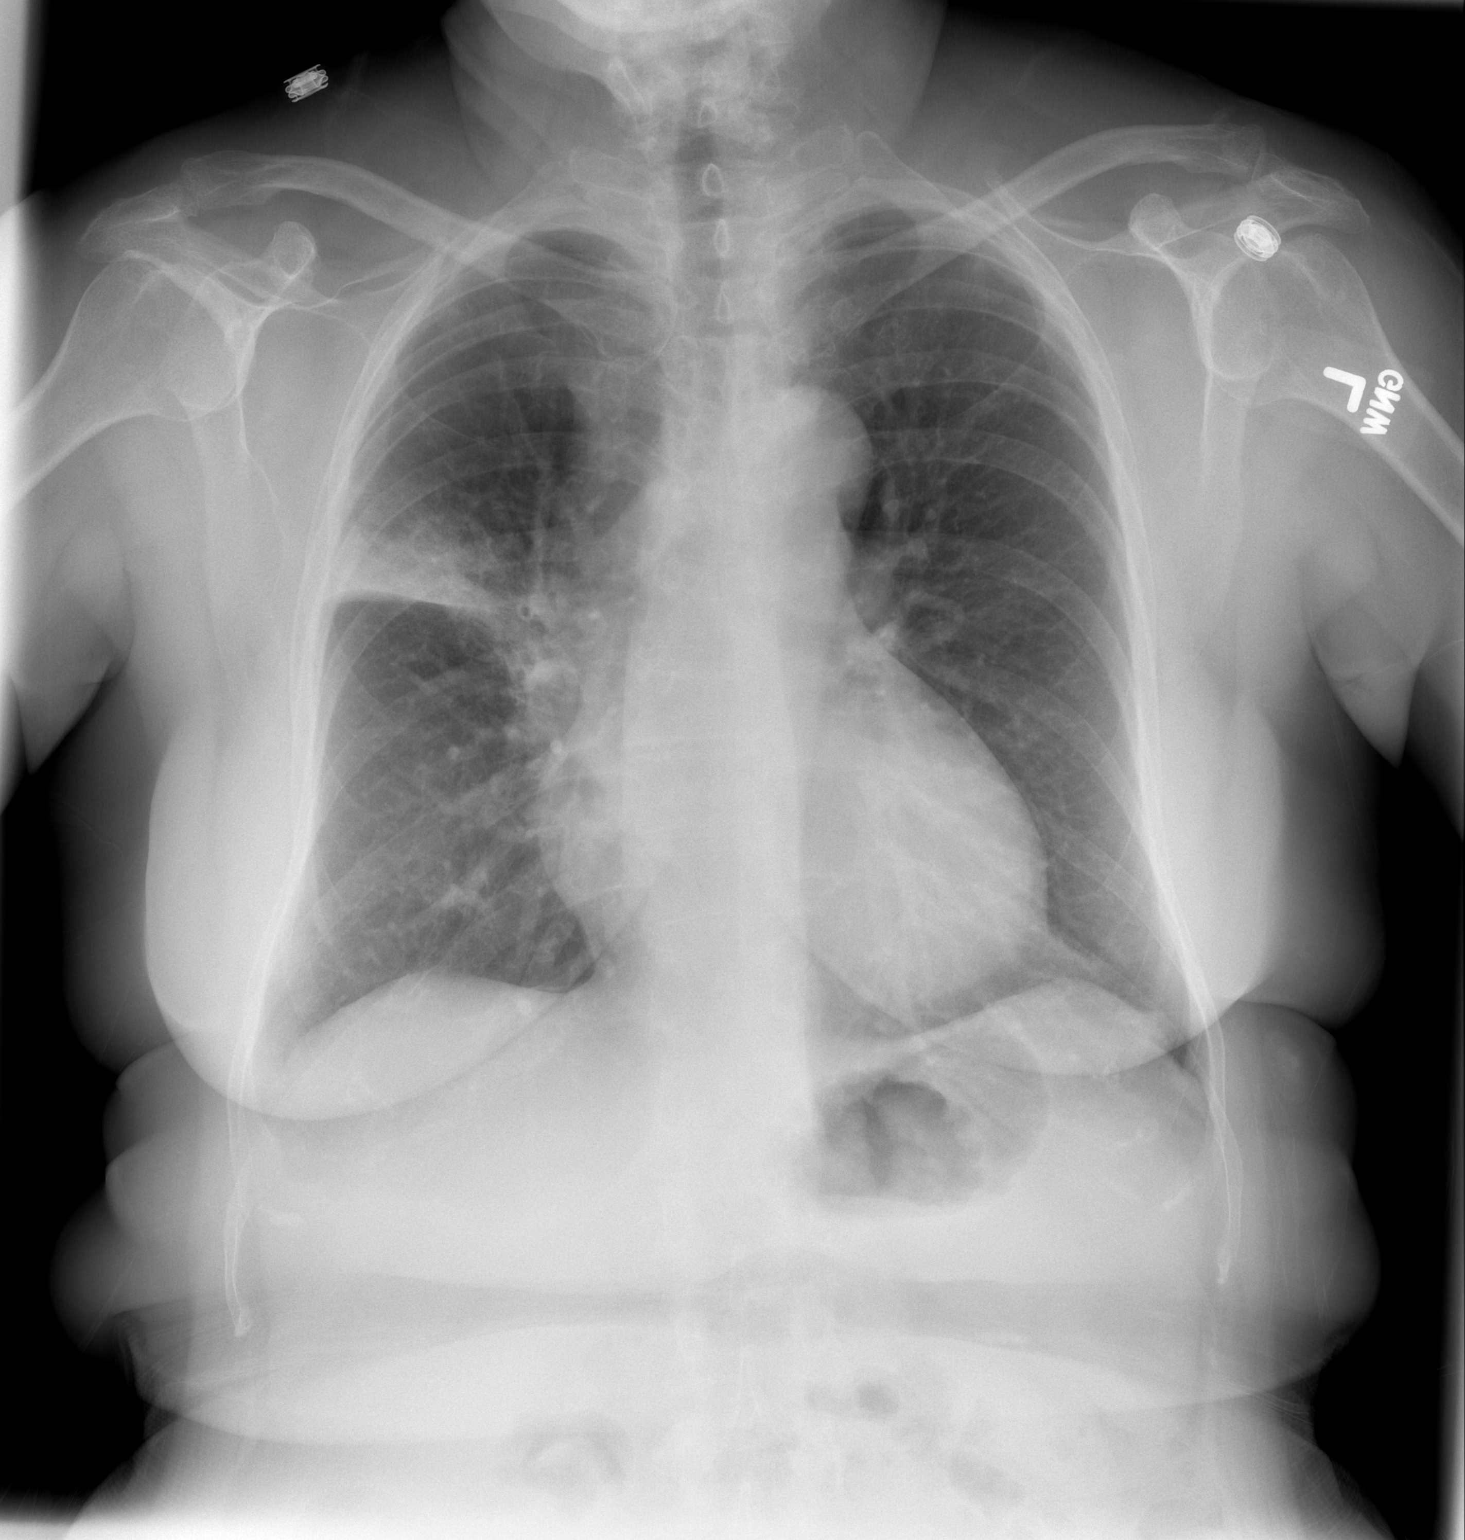

[w chest lat]
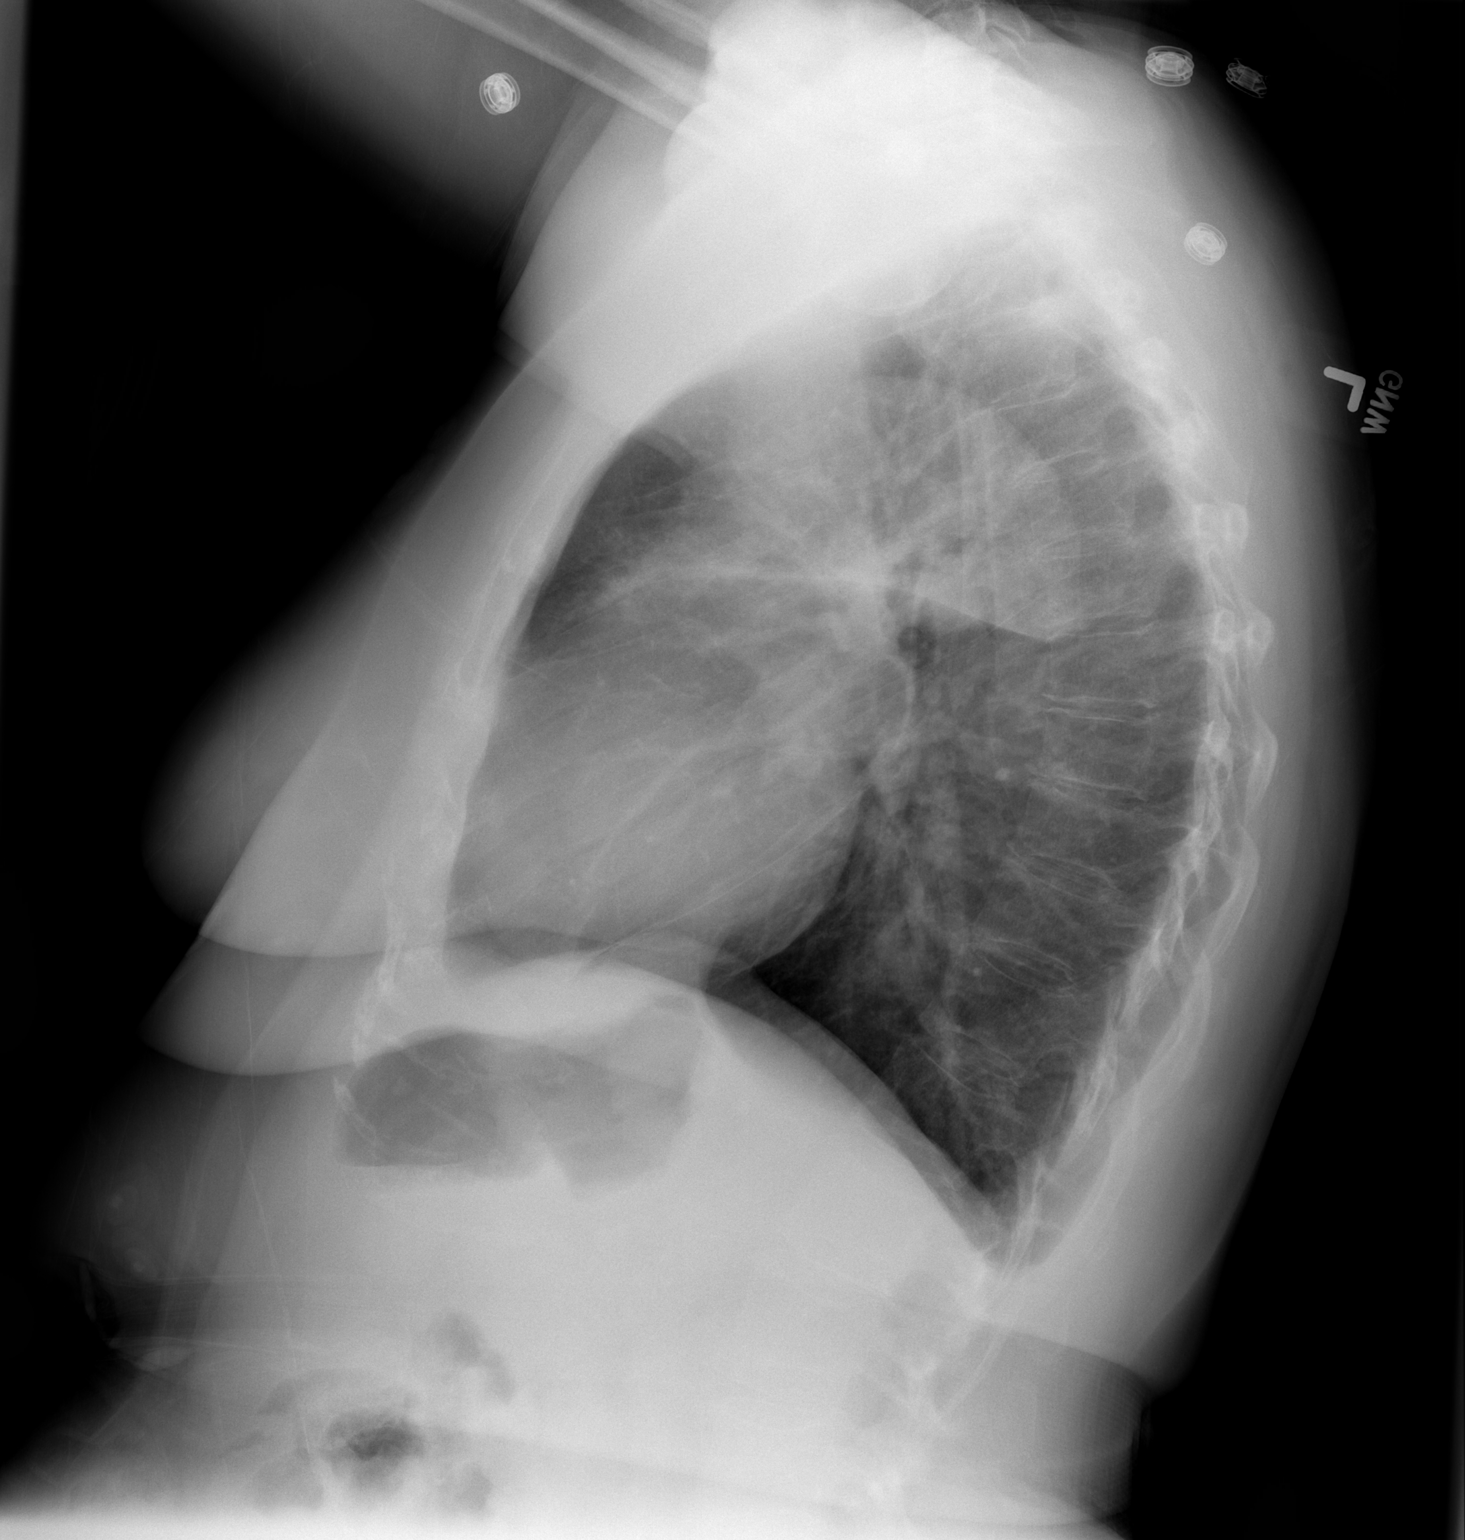

[2 of 2 positions shown; findings below may reference images not displayed]

FINDINGS: Heart size and vascular pattern are normal. Lungs clear. No pleural
effusions.

Focus of consolidation in the inferior aspect of the right upper
lobe, slightly larger when compared to study performed earlier
today.
IMPRESSION: Right upper lobe pneumonia. Recommend radiographic follow-up to
ensure resolution.
